# Patient Record
Sex: Male | Born: 1985 | Race: Black or African American | Hispanic: No | Marital: Single | Smoking: Current every day smoker
Health system: Southern US, Community
[De-identification: ages and names within clinical notes are randomized; demographics above are authoritative.]

## PROBLEM LIST (undated history)

## (undated) DIAGNOSIS — F1090 Alcohol use, unspecified, uncomplicated: Secondary | ICD-10-CM

## (undated) DIAGNOSIS — Z7289 Other problems related to lifestyle: Secondary | ICD-10-CM

## (undated) DIAGNOSIS — Z720 Tobacco use: Secondary | ICD-10-CM

## (undated) DIAGNOSIS — R569 Unspecified convulsions: Secondary | ICD-10-CM

---
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
## 2012-08-06 ENCOUNTER — Emergency Department (HOSPITAL_COMMUNITY)
Admission: EM | Admit: 2012-08-06 | Discharge: 2012-08-06 | Disposition: A | Discharge status: Home / Self Care | Payer: Self-pay | Attending: Emergency Medicine | Admitting: Emergency Medicine

## 2012-08-06 ENCOUNTER — Encounter (HOSPITAL_COMMUNITY): Payer: Self-pay | Admitting: Emergency Medicine

## 2012-08-06 DIAGNOSIS — L639 Alopecia areata, unspecified: Secondary | ICD-10-CM | POA: Insufficient documentation

## 2012-08-06 DIAGNOSIS — F17200 Nicotine dependence, unspecified, uncomplicated: Secondary | ICD-10-CM | POA: Insufficient documentation

## 2012-08-06 DIAGNOSIS — B359 Dermatophytosis, unspecified: Secondary | ICD-10-CM | POA: Insufficient documentation

## 2012-08-06 NOTE — ED Provider Notes (Signed)
History     CSN: [id number]  Arrival date & time 08/06/12  1012   First MD Initiated Contact with Patient 08/06/12 1014      Chief Complaint  Patient presents with  . Tinea  . Alopecia    (Consider location/radiation/quality/duration/timing/severity/associated sxs/prior treatment) HPI Comments: Patient is a 27 year old melena significant past medical history who presents for hair loss x6 months. Patient states that he has noticed a bald spot increasing in size over time and denies any drugs or clumps of hair falling out at one time. Patient admits to associated itching and denies redness or scaling of the area. Patient denies any aggravating or alleviating factors of his symptoms. He further denies fever, headache, numbness or tingling, as well as any drainage from the site. Patient states he has been homeless and is concerned about ringworm  The history is provided by the patient. No language interpreter was used.    History reviewed. No pertinent past medical history.  History reviewed. No pertinent past surgical history.  No family history on file.  History  Substance Use Topics  . Smoking status: Current Every Day Smoker  . Smokeless tobacco: Not on file  . Alcohol Use: Yes     Review of Systems  Constitutional: Negative for fever.  HENT:       +hair loss  All other systems reviewed and are negative.    Allergies  Review of patient's allergies indicates no known allergies.  Home Medications  No current outpatient prescriptions on file.  BP 137/97  Pulse 83  Temp(Src) 99 F (37.2 C) (Oral)  Resp 18  SpO2 100%  Physical Exam  Nursing note and vitals reviewed. Constitutional: He is oriented to person, place, and time. He appears well-developed and well-nourished. No distress.  HENT:  Head: Normocephalic and atraumatic. Hair is abnormal.  Right Ear: External ear normal.  Left Ear: External ear normal.  Nose: Nose normal.  +area of baldness on posterior  scalp approximately 5cm in diameter; hair extremely fine and thin toward inner border. Scalp without redness, scaling, or lesions.   Eyes: Conjunctivae and EOM are normal. No scleral icterus.  Neck: Normal range of motion.  Cardiovascular: Normal rate, regular rhythm and intact distal pulses.   Pulmonary/Chest: Effort normal. No respiratory distress.  Musculoskeletal: Normal range of motion.  Neurological: He is alert and oriented to person, place, and time.  Skin: Skin is warm and dry. No rash noted. He is not diaphoretic. No erythema. No pallor.  Psychiatric: He has a normal mood and affect. His behavior is normal.    ED Course  Procedures (including critical care time)  Labs Reviewed - No data to display No results found.   1. Alopecia areata     MDM  Alopecia areata - uncomplicated. No redness, heat to touch, scalp swelling, or scaling to suggest tinea or cellulits. Patient stable for discharge with dermatology followup. Patient also given resource guide for primary care followup. Indications for ED return discussed. Patient states comfort and understanding with this discharge plan with no unaddressed concerns. Patient seen also by Dr. Estell Harpin who is in agreement with this discharge and management plan.   Filed Vitals:   08/06/12 1023 08/06/12 1145  BP: 137/97 140/99  Pulse: 83 80  Temp: 99 F (37.2 C) 97.7 F (36.5 C)  TempSrc: Oral Oral  Resp: 18 16  SpO2: 100% 100%           Antony Madura, PA-C 08/06/12 1639

## 2012-08-06 NOTE — ED Notes (Addendum)
Pt c/o possible ringworm in head area. Pt reports been losing hair for over a year. Pt has been sleeping on the streets. Pt also reports flaky skin on bottom of feet. Pt also c/o low back pain.

## 2012-08-07 NOTE — ED Provider Notes (Signed)
Medical screening examination/treatment/procedure(s) were performed by non-physician practitioner and as supervising physician I was immediately available for consultation/collaboration.   Benny Lennert, MD 08/07/12 2330

## 2019-02-17 ENCOUNTER — Other Ambulatory Visit: Payer: Self-pay

## 2019-02-17 DIAGNOSIS — Z20822 Contact with and (suspected) exposure to covid-19: Secondary | ICD-10-CM

## 2019-02-19 LAB — NOVEL CORONAVIRUS, NAA: SARS-CoV-2, NAA: NOT DETECTED

## 2019-11-27 ENCOUNTER — Encounter (HOSPITAL_COMMUNITY): Payer: Self-pay

## 2019-11-27 ENCOUNTER — Observation Stay (HOSPITAL_COMMUNITY)
Admission: EM | Admit: 2019-11-27 | Discharge: 2019-11-28 | Disposition: A | Discharge status: Home / Self Care | Payer: Self-pay | Attending: Internal Medicine | Admitting: Internal Medicine

## 2019-11-27 ENCOUNTER — Observation Stay (HOSPITAL_COMMUNITY): Payer: Self-pay

## 2019-11-27 ENCOUNTER — Other Ambulatory Visit: Payer: Self-pay

## 2019-11-27 ENCOUNTER — Emergency Department (HOSPITAL_COMMUNITY): Payer: Self-pay

## 2019-11-27 DIAGNOSIS — F1290 Cannabis use, unspecified, uncomplicated: Secondary | ICD-10-CM | POA: Diagnosis present

## 2019-11-27 DIAGNOSIS — Z789 Other specified health status: Secondary | ICD-10-CM

## 2019-11-27 DIAGNOSIS — Z720 Tobacco use: Secondary | ICD-10-CM | POA: Diagnosis present

## 2019-11-27 DIAGNOSIS — R7401 Elevation of levels of liver transaminase levels: Secondary | ICD-10-CM | POA: Diagnosis present

## 2019-11-27 DIAGNOSIS — Z7289 Other problems related to lifestyle: Secondary | ICD-10-CM

## 2019-11-27 DIAGNOSIS — Z20822 Contact with and (suspected) exposure to covid-19: Secondary | ICD-10-CM | POA: Insufficient documentation

## 2019-11-27 DIAGNOSIS — F17200 Nicotine dependence, unspecified, uncomplicated: Secondary | ICD-10-CM | POA: Insufficient documentation

## 2019-11-27 DIAGNOSIS — F1920 Other psychoactive substance dependence, uncomplicated: Secondary | ICD-10-CM | POA: Insufficient documentation

## 2019-11-27 DIAGNOSIS — R569 Unspecified convulsions: Principal | ICD-10-CM

## 2019-11-27 DIAGNOSIS — F1090 Alcohol use, unspecified, uncomplicated: Secondary | ICD-10-CM

## 2019-11-27 DIAGNOSIS — E871 Hypo-osmolality and hyponatremia: Secondary | ICD-10-CM | POA: Diagnosis present

## 2019-11-27 DIAGNOSIS — R55 Syncope and collapse: Secondary | ICD-10-CM | POA: Insufficient documentation

## 2019-11-27 DIAGNOSIS — R413 Other amnesia: Secondary | ICD-10-CM | POA: Insufficient documentation

## 2019-11-27 HISTORY — DX: Tobacco use: Z72.0

## 2019-11-27 HISTORY — DX: Other problems related to lifestyle: Z72.89

## 2019-11-27 HISTORY — DX: Alcohol use, unspecified, uncomplicated: F10.90

## 2019-11-27 LAB — CBC WITH DIFFERENTIAL/PLATELET
Abs Immature Granulocytes: 0.03 10*3/uL (ref 0.00–0.07)
Basophils Absolute: 0 10*3/uL (ref 0.0–0.1)
Basophils Relative: 0 %
Eosinophils Absolute: 0 10*3/uL (ref 0.0–0.5)
Eosinophils Relative: 0 %
HCT: 37.2 % — ABNORMAL LOW (ref 39.0–52.0)
Hemoglobin: 12.2 g/dL — ABNORMAL LOW (ref 13.0–17.0)
Immature Granulocytes: 0 %
Lymphocytes Relative: 16 %
Lymphs Abs: 1.5 10*3/uL (ref 0.7–4.0)
MCH: 26.4 pg (ref 26.0–34.0)
MCHC: 32.8 g/dL (ref 30.0–36.0)
MCV: 80.5 fL (ref 80.0–100.0)
Monocytes Absolute: 0.9 10*3/uL (ref 0.1–1.0)
Monocytes Relative: 10 %
Neutro Abs: 6.7 10*3/uL (ref 1.7–7.7)
Neutrophils Relative %: 74 %
Platelets: 192 10*3/uL (ref 150–400)
RBC: 4.62 MIL/uL (ref 4.22–5.81)
RDW: 15.1 % (ref 11.5–15.5)
WBC: 9.2 10*3/uL (ref 4.0–10.5)
nRBC: 0 % (ref 0.0–0.2)

## 2019-11-27 LAB — ACETAMINOPHEN LEVEL: Acetaminophen (Tylenol), Serum: <10 ug/mL — ABNORMAL LOW (ref 10–30)

## 2019-11-27 LAB — COMPREHENSIVE METABOLIC PANEL
ALT: 72 U/L — ABNORMAL HIGH (ref 0–44)
AST: 115 U/L — ABNORMAL HIGH (ref 15–41)
Albumin: 4.1 g/dL (ref 3.5–5.0)
Alkaline Phosphatase: 63 U/L (ref 38–126)
Anion gap: 14 (ref 5–15)
BUN: 7 mg/dL (ref 6–20)
CO2: 17 mmol/L — ABNORMAL LOW (ref 22–32)
Calcium: 9.1 mg/dL (ref 8.9–10.3)
Chloride: 96 mmol/L — ABNORMAL LOW (ref 98–111)
Creatinine, Ser: 0.97 mg/dL (ref 0.61–1.24)
GFR calc Af Amer: >60 mL/min (ref >60)
GFR calc non Af Amer: >60 mL/min (ref >60)
Glucose, Bld: 96 mg/dL (ref 70–99)
Potassium: 4 mmol/L (ref 3.5–5.1)
Sodium: 127 mmol/L — ABNORMAL LOW (ref 135–145)
Total Bilirubin: 1 mg/dL (ref 0.3–1.2)
Total Protein: 7.4 g/dL (ref 6.5–8.1)

## 2019-11-27 LAB — RAPID URINE DRUG SCREEN, HOSP PERFORMED
Amphetamines: NOT DETECTED
Barbiturates: NOT DETECTED
Benzodiazepines: NOT DETECTED
Cocaine: NOT DETECTED
Opiates: NOT DETECTED
Tetrahydrocannabinol: POSITIVE — AB

## 2019-11-27 LAB — CBG MONITORING, ED: Glucose-Capillary: 99 mg/dL (ref 70–99)

## 2019-11-27 LAB — HEPATITIS PANEL, ACUTE
HCV Ab: NONREACTIVE
Hep A IgM: NONREACTIVE
Hep B C IgM: NONREACTIVE
Hepatitis B Surface Ag: NONREACTIVE

## 2019-11-27 LAB — HIV ANTIBODY (ROUTINE TESTING W REFLEX): HIV Screen 4th Generation wRfx: NONREACTIVE

## 2019-11-27 LAB — ETHANOL: Alcohol, Ethyl (B): <10 mg/dL (ref <10)

## 2019-11-27 LAB — OSMOLALITY, URINE: Osmolality, Ur: 435 mOsm/kg (ref 300–900)

## 2019-11-27 LAB — MAGNESIUM: Magnesium: 2 mg/dL (ref 1.7–2.4)

## 2019-11-27 LAB — TSH: TSH: 2.449 u[IU]/mL (ref 0.350–4.500)

## 2019-11-27 LAB — SARS CORONAVIRUS 2 BY RT PCR (HOSPITAL ORDER, PERFORMED IN ~~LOC~~ HOSPITAL LAB): SARS Coronavirus 2: NEGATIVE

## 2019-11-27 LAB — SODIUM, URINE, RANDOM: Sodium, Ur: 97 mmol/L

## 2019-11-27 LAB — SALICYLATE LEVEL: Salicylate Lvl: <7 mg/dL — ABNORMAL LOW (ref 7.0–30.0)

## 2019-11-27 MED ORDER — LORAZEPAM 1 MG PO TABS: 1.0000 mg | ORAL_TABLET | ORAL | Status: DC | PRN

## 2019-11-27 MED ORDER — THIAMINE HCL 100 MG/ML IJ SOLN
100.0000 mg | Freq: Every day | INTRAMUSCULAR | Status: DC
  Administered 2019-11-27: 100 mg via INTRAVENOUS
  Filled 2019-11-27: qty 2

## 2019-11-27 MED ORDER — LORAZEPAM 2 MG/ML IJ SOLN: 1.0000 mg | INTRAMUSCULAR | Status: DC | PRN

## 2019-11-27 MED ORDER — LORAZEPAM 2 MG/ML IJ SOLN: 0.0000 mg | Freq: Two times a day (BID) | INTRAMUSCULAR | Status: DC

## 2019-11-27 MED ORDER — ALBUTEROL SULFATE (2.5 MG/3ML) 0.083% IN NEBU: 2.5000 mg | INHALATION_SOLUTION | Freq: Four times a day (QID) | RESPIRATORY_TRACT | Status: DC | PRN

## 2019-11-27 MED ORDER — LORAZEPAM 2 MG/ML IJ SOLN
0.0000 mg | Freq: Four times a day (QID) | INTRAMUSCULAR | Status: DC
  Administered 2019-11-27: 1 mg via INTRAVENOUS
  Filled 2019-11-27 (×2): qty 1

## 2019-11-27 MED ORDER — ACETAMINOPHEN 650 MG RE SUPP: 650.0000 mg | Freq: Four times a day (QID) | RECTAL | Status: DC | PRN

## 2019-11-27 MED ORDER — LORAZEPAM 2 MG/ML IJ SOLN
INTRAMUSCULAR | Status: AC
  Administered 2019-11-27: 2 mg via INTRAVENOUS
  Filled 2019-11-27: qty 1

## 2019-11-27 MED ORDER — ADULT MULTIVITAMIN W/MINERALS CH
1.0000 | ORAL_TABLET | Freq: Every day | ORAL | Status: DC
  Administered 2019-11-27 – 2019-11-28 (×2): 1 via ORAL
  Filled 2019-11-27 (×2): qty 1

## 2019-11-27 MED ORDER — ACETAMINOPHEN 325 MG PO TABS: 650.0000 mg | ORAL_TABLET | Freq: Four times a day (QID) | ORAL | Status: DC | PRN

## 2019-11-27 MED ORDER — SODIUM CHLORIDE 0.9 % IV SOLN
75.0000 mL/h | INTRAVENOUS | Status: DC
  Administered 2019-11-27: 75 mL/h via INTRAVENOUS

## 2019-11-27 MED ORDER — ONDANSETRON HCL 4 MG PO TABS: 4.0000 mg | ORAL_TABLET | Freq: Four times a day (QID) | ORAL | Status: DC | PRN

## 2019-11-27 MED ORDER — ONDANSETRON HCL 4 MG/2ML IJ SOLN: 4.0000 mg | Freq: Four times a day (QID) | INTRAMUSCULAR | Status: DC | PRN

## 2019-11-27 MED ORDER — METOPROLOL TARTRATE 5 MG/5ML IV SOLN: 5.0000 mg | Freq: Four times a day (QID) | INTRAVENOUS | Status: DC | PRN

## 2019-11-27 MED ORDER — LORAZEPAM 2 MG/ML IJ SOLN
2.0000 mg | Freq: Once | INTRAMUSCULAR | Status: AC
  Administered 2019-11-27: 2 mg via INTRAVENOUS

## 2019-11-27 MED ORDER — NICOTINE 21 MG/24HR TD PT24
21.0000 mg | MEDICATED_PATCH | Freq: Every day | TRANSDERMAL | Status: DC
  Administered 2019-11-27 – 2019-11-28 (×2): 21 mg via TRANSDERMAL
  Filled 2019-11-27 (×2): qty 1

## 2019-11-27 MED ORDER — ENOXAPARIN SODIUM 40 MG/0.4ML ~~LOC~~ SOLN
40.0000 mg | SUBCUTANEOUS | Status: DC
  Administered 2019-11-28: 40 mg via SUBCUTANEOUS
  Filled 2019-11-27: qty 0.4

## 2019-11-27 MED ORDER — FOLIC ACID 1 MG PO TABS
1.0000 mg | ORAL_TABLET | Freq: Every day | ORAL | Status: DC
  Administered 2019-11-27 – 2019-11-28 (×2): 1 mg via ORAL
  Filled 2019-11-27 (×2): qty 1

## 2019-11-27 MED ORDER — THIAMINE HCL 100 MG PO TABS
100.0000 mg | ORAL_TABLET | Freq: Every day | ORAL | Status: DC
  Administered 2019-11-28: 100 mg via ORAL
  Filled 2019-11-27 (×2): qty 1

## 2019-11-27 MED ORDER — KETOROLAC TROMETHAMINE 30 MG/ML IJ SOLN: 30.0000 mg | Freq: Once | INTRAMUSCULAR | Status: DC

## 2019-11-27 MED ORDER — LEVETIRACETAM IN NACL 1500 MG/100ML IV SOLN
1500.0000 mg | Freq: Once | INTRAVENOUS | Status: AC
  Administered 2019-11-27: 1500 mg via INTRAVENOUS
  Filled 2019-11-27: qty 100

## 2019-11-27 MED ORDER — MAGNESIUM SULFATE 2 GM/50ML IV SOLN: 2.0000 g | Freq: Once | INTRAVENOUS | Status: DC

## 2019-11-27 NOTE — ED Provider Notes (Signed)
[facility] EMERGENCY DEPARTMENT Provider Note   CSN: [id number] Arrival date & time: 11/27/19  2025     History Chief Complaint  Patient presents with  . Seizures    Joshua Andrews is a 34 y.o. male presenting for evaluation after seizure.  Per EMS, mom witnessed tonic-clonic movement that lasted approximately 1 minute.  Patient had urinary incontinence, was postictal upon EMS arrival.  Patient then returned to baseline mentation.  No medications were given. Upon my evaluation, patient reports he is symptom-free.  Denies headache or body pain.  He denies recent fevers, chills, cough, nausea, vomiting, abd pain, urinary symptoms, normal bowel movements.  He states he has had a beer today, no other alcohol.  He denies tobacco or drug use.  He reports no history of medical problems, takes medications daily.  No previous history of seizures.  He denies a recent history of trauma.  HPI     History reviewed. No pertinent past medical history.  Patient Active Problem List   Diagnosis Date Noted  . Seizures (HCC) 11/27/2019    History reviewed. No pertinent surgical history.     History reviewed. No pertinent family history.  Social History   Tobacco Use  . Smoking status: Current Every Day Smoker  Substance Use Topics  . Alcohol use: Yes  . Drug use: Yes    Types: Marijuana    Home Medications Prior to Admission medications   Not on File    Allergies    Patient has no known allergies.  Review of Systems   Review of Systems  Neurological: Positive for seizures.  All other systems reviewed and are negative.   Physical Exam Updated Vital Signs BP (!) 133/96   Pulse (!) 127   Temp 98.9 F (37.2 C) (Oral)   Resp 18   Ht 5\' 11"  (1.803 m)   Wt 86.2 kg   SpO2 100%   BMI 26.50 kg/m   Physical Exam Vitals and nursing note reviewed.  Constitutional:      General: He is not in acute distress.    Appearance: He is well-developed.      Comments: Resting in the bed in no acute distress  HENT:     Head: Normocephalic and atraumatic.  Eyes:     Conjunctiva/sclera: Conjunctivae normal.     Pupils: Pupils are equal, round, and reactive to light.  Cardiovascular:     Rate and Rhythm: Normal rate and regular rhythm.     Pulses: Normal pulses.  Pulmonary:     Effort: Pulmonary effort is normal. No respiratory distress.     Breath sounds: Normal breath sounds. No wheezing.  Abdominal:     General: There is no distension.     Palpations: Abdomen is soft. There is no mass.     Tenderness: There is no abdominal tenderness. There is no guarding or rebound.  Musculoskeletal:        General: Normal range of motion.     Cervical back: Normal range of motion and neck supple.  Skin:    General: Skin is warm and dry.     Capillary Refill: Capillary refill takes less than 2 seconds.  Neurological:     Mental Status: He is alert and oriented to person, place, and time.     GCS: GCS eye subscore is 4. GCS verbal subscore is 5. GCS motor subscore is 6.     Cranial Nerves: Cranial nerves are intact.     Sensory: Sensation is  intact.     Motor: Motor function is intact.     Comments: No obvious neurologic deficit.  CN intact.  Strength and sensation intact x4.     ED Results / Procedures / Treatments   Labs (all labs ordered are listed, but only abnormal results are displayed) Labs Reviewed  CBC WITH DIFFERENTIAL/PLATELET - Abnormal; Notable for the following components:      Result Value   Hemoglobin 12.2 (*)    HCT 37.2 (*)    All other components within normal limits  COMPREHENSIVE METABOLIC PANEL - Abnormal; Notable for the following components:   Sodium 127 (*)    Chloride 96 (*)    CO2 17 (*)    AST 115 (*)    ALT 72 (*)    All other components within normal limits  SALICYLATE LEVEL - Abnormal; Notable for the following components:   Salicylate Lvl <7.0 (*)    All other components within normal limits  ACETAMINOPHEN  LEVEL - Abnormal; Notable for the following components:   Acetaminophen (Tylenol), Serum <10 (*)    All other components within normal limits  RAPID URINE DRUG SCREEN, HOSP PERFORMED - Abnormal; Notable for the following components:   Tetrahydrocannabinol POSITIVE (*)    All other components within normal limits  SARS CORONAVIRUS 2 BY RT PCR (HOSPITAL ORDER, PERFORMED IN  HOSPITAL LAB)  ETHANOL  MAGNESIUM  CBG MONITORING, ED    EKG None  Radiology CT HEAD WO CONTRAST  Result Date: 11/27/2019 CLINICAL DATA:  Seizure EXAM: CT HEAD WITHOUT CONTRAST TECHNIQUE: Contiguous axial images were obtained from the base of the skull through the vertex without intravenous contrast. COMPARISON:  CT head 05/05/2000 (report only) FINDINGS: Brain: No evidence of acute infarction, hemorrhage, hydrocephalus, extra-axial collection or mass lesion/mass effect. Incidental note made of a cavum septum pellucidum et vergae. Midline intracranial structures otherwise normal. Basal cisterns are patent. Cerebellar tonsils are normally position. Vascular: No hyperdense vessel or unexpected calcification. Skull: No calvarial fracture or suspicious osseous lesion. No scalp swelling or hematoma. Sinuses/Orbits: Paranasal sinuses and mastoid air cells are predominantly clear. Mild proptosis. Orbits are otherwise unremarkable. Other: None IMPRESSION: 1. No acute intracranial findings. 2. Mild proptosis. Electronically Signed   By: Kreg Shropshire M.D.   On: 11/27/2019 04:41    Procedures Procedures (including critical care time)  Medications Ordered in ED Medications  LORazepam (ATIVAN) 2 MG/ML injection (has no administration in time range)  LORazepam (ATIVAN) injection 2 mg (2 mg Intravenous Given 11/27/19 0313)  levETIRAcetam (KEPPRA) IVPB 1500 mg/ 100 mL premix (0 mg Intravenous Stopped 11/27/19 0414)    ED Course  I have reviewed the triage vital signs and the nursing notes.  Pertinent labs & imaging  results that were available during my care of the patient were reviewed by me and considered in my medical decision making (see chart for details).    MDM Rules/Calculators/A&P                          Patient presenting for evaluation of seizure.  On exam, patient appears nontoxic.  This is a new onset seizure, as such will obtain labs and head CT.  Patient reports alcohol use, will obtain ethanol, as well as salicylate and acetaminophen levels.  Informed by RN that patient is having a seizure.  He had approximately 1 minute of tonic-clonic movement and urinary incontinence.  He was placed on a nonrebreather during the seizure for  support.  Patient was evaluated by Dr. Daun Peacock.  Given Ativan and Keppra.  Will consult with neurology.  Discussed with Dr. Derry Lory from neurology, who recommends MRI and admission to medicine.   Labs interpreted by me, overall reassuring.  Mild acidosis extremity, likely due to seizure.  Of note, LFTs are mildly elevated, but not to the point that I do expect seizures.  Hemoglobin is stable.  Salicylate and acetaminophen levels normal.  UDS positive only for marijuana.  Head CT negative for acute findings.  Will call for admission.  Discussed with Dr. Loney Loh from [facility], patient to be admitted.  Final Clinical Impression(s) / ED Diagnoses Final diagnoses:  Seizure [facility])    Rx / DC Orders ED Discharge Orders    None       Alveria Apley, PA-C 11/27/19 0617    Palumbo, April, MD 11/27/19 2774

## 2019-11-27 NOTE — Progress Notes (Signed)
EEG complete - results pending 

## 2019-11-27 NOTE — Procedures (Signed)
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 EEG to evaluate for seizure  Level of alertness: Awake,  asleep  AEDs during EEG study: Ativan  Technical aspects: This EEG study was done with scalp electrodes positioned according to the 10-20 International system of electrode placement. Electrical activity was acquired at a sampling rate of 500Hz  and reviewed with a high frequency filter of 70Hz  and a low frequency filter of 1Hz . EEG data were recorded continuously and digitally stored.   Description: No posterior dominant rhythm was seen. Sleep was characterized by vertex waves, sleep spindles (12 to 14 Hz), maximal frontocentral region.  There is an excessive amount of 15 to 18 Hz beta activity with distributed symmetrically and diffusely.  Hyperventilation and photic stimulation were not performed.     ABNORMALITY -Excessive beta, generalized  IMPRESSION: This study is within normal limits. No seizures or epileptiform discharges were seen throughout the recording. The excessive beta activity seen in the background is most likely due to the effect of benzodiazepine and is a benign EEG pattern.  Joshua Andrews 
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
## 2019-11-27 NOTE — Care Plan (Signed)
Subjective: Patient's mother at bedside states patient has not had any further seizure-like activity.  He has been able to wake up and talk to his mother but continues to be very drowsy.  She states he does drink alcohol frequently but does not know the exact quantity.  Pertinent labs: Sodium 127, chloride 96, AST 115, ALT 72, UDS positive for THC, alcohol level less than 10  MRI brain without contrast reviewed: No acute abnormality.  Recommendations -Routine EEG to look for epileptogenicity is pending. -If routine EEG normal, this is most likely provoked seizure in setting of alcohol withdrawal and therefore does not need require long-term AEDs. -Continue management of hyponatremia per primary team -Alcohol cessation counseling -Seizure precautions including do not drive for 6 months/until cleared by physician    Seizure precautions: Per [facility] statutes, patients with seizures are not allowed to drive until they have been seizure-free for six months and cleared by a physician    Use caution when using heavy equipment or power tools. Avoid working on ladders or at heights. Take showers instead of baths. Ensure the water temperature is not too high on the home water heater. Do not go swimming alone. Do not lock yourself in a room alone (i.e. bathroom). When caring for infants or small children, sit down when holding, feeding, or changing them to minimize risk of injury to the child in the event you have a seizure. Maintain good sleep hygiene. Avoid alcohol.    If patient has another seizure, call 911 and bring them back to the ED if: A.  The seizure lasts longer than 5 minutes.      B.  The patient doesn't wake shortly after the seizure or has new problems such as difficulty seeing, speaking or moving following the seizure C.  The patient was injured during the seizure D.  The patient has a temperature over 102 F (39C) E.  The patient vomited during the seizure and now is having  trouble breathing    During the Seizure   - First, ensure adequate ventilation and place patients on the floor on their left side  Loosen clothing around the neck and ensure the airway is patent. If the patient is clenching the teeth, do not force the mouth open with any object as this can cause severe damage - Remove all items from the surrounding that can be hazardous. The patient may be oblivious to what's happening and may not even know what he or she is doing. If the patient is confused and wandering, either gently guide him/her away and block access to outside areas - Reassure the individual and be comforting - Call 911. In most cases, the seizure ends before EMS arrives. However, there are cases when seizures may last over 3 to 5 minutes. Or the individual may have developed breathing difficulties or severe injuries. If a pregnant patient or a person with diabetes develops a seizure, it is prudent to call an ambulance. - Finally, if the patient does not regain full consciousness, then call EMS. Most patients will remain confused for about 45 to 90 minutes after a seizure, so you must use judgment in calling for help. - Avoid restraints but make sure the patient is in a bed with padded side rails - Place the individual in a lateral position with the neck slightly flexed; this will help the saliva drain from the mouth and prevent the tongue from falling backward - Remove all nearby furniture and other hazards from the  area - Provide verbal assurance as the individual is regaining consciousness - Provide the patient with privacy if possible - Call for help and start treatment as ordered by the caregiver    After the Seizure (Postictal Stage)   After a seizure, most patients experience confusion, fatigue, muscle pain and/or a headache. Thus, one should permit the individual to sleep. For the next few days, reassurance is essential. Being calm and helping reorient the person is also of  importance.   Most seizures are painless and end spontaneously. Seizures are not harmful to others but can lead to complications such as stress on the lungs, brain and the heart. Individuals with prior lung problems may develop labored breathing and respiratory distress.    Sinan Tuch Annabelle Harman

## 2019-11-27 NOTE — ED Triage Notes (Signed)
Pt BIB [facility] for eval of approx 2 minute grand mal seizure witnessed by mother. EMS reports significant post-ictal period, snoring respirations. EMS reports pt did come around en route, states rude and non compliant during transport. Removed IV/EKG en route.
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
## 2019-11-27 NOTE — Progress Notes (Signed)
Pt is at MRI at this time - unavailable for EEG. Will check back as schedule permits.

## 2019-11-28 LAB — COMPREHENSIVE METABOLIC PANEL
ALT: 54 U/L — ABNORMAL HIGH (ref 0–44)
AST: 55 U/L — ABNORMAL HIGH (ref 15–41)
Albumin: 3.7 g/dL (ref 3.5–5.0)
Alkaline Phosphatase: 67 U/L (ref 38–126)
Anion gap: 12 (ref 5–15)
BUN: 7 mg/dL (ref 6–20)
CO2: 23 mmol/L (ref 22–32)
Calcium: 9.1 mg/dL (ref 8.9–10.3)
Chloride: 100 mmol/L (ref 98–111)
Creatinine, Ser: 0.96 mg/dL (ref 0.61–1.24)
GFR calc Af Amer: >60 mL/min (ref >60)
GFR calc non Af Amer: >60 mL/min (ref >60)
Glucose, Bld: 112 mg/dL — ABNORMAL HIGH (ref 70–99)
Potassium: 3.3 mmol/L — ABNORMAL LOW (ref 3.5–5.1)
Sodium: 135 mmol/L (ref 135–145)
Total Bilirubin: 1.1 mg/dL (ref 0.3–1.2)
Total Protein: 6.8 g/dL (ref 6.5–8.1)

## 2019-11-28 LAB — CBC
HCT: 36.8 % — ABNORMAL LOW (ref 39.0–52.0)
Hemoglobin: 12.3 g/dL — ABNORMAL LOW (ref 13.0–17.0)
MCH: 27.2 pg (ref 26.0–34.0)
MCHC: 33.4 g/dL (ref 30.0–36.0)
MCV: 81.4 fL (ref 80.0–100.0)
Platelets: 201 10*3/uL (ref 150–400)
RBC: 4.52 MIL/uL (ref 4.22–5.81)
RDW: 15.1 % (ref 11.5–15.5)
WBC: 9.5 10*3/uL (ref 4.0–10.5)
nRBC: 0 % (ref 0.0–0.2)

## 2019-11-28 LAB — PHOSPHORUS: Phosphorus: 4.1 mg/dL (ref 2.5–4.6)

## 2019-11-28 LAB — MAGNESIUM: Magnesium: 2.1 mg/dL (ref 1.7–2.4)

## 2019-11-28 MED ORDER — CHLORDIAZEPOXIDE HCL 25 MG PO CAPS: ORAL_CAPSULE | ORAL | 0 refills | Status: DC

## 2019-11-28 MED ORDER — CARVEDILOL 3.125 MG PO TABS: 3.1250 mg | ORAL_TABLET | Freq: Two times a day (BID) | ORAL | 11 refills | Status: DC

## 2019-11-28 MED ORDER — FOLIC ACID 1 MG PO TABS: 1.0000 mg | ORAL_TABLET | Freq: Every day | ORAL | 1 refills | Status: AC

## 2019-11-28 MED ORDER — POTASSIUM CHLORIDE CRYS ER 20 MEQ PO TBCR: 40.0000 meq | EXTENDED_RELEASE_TABLET | Freq: Once | ORAL | Status: DC

## 2019-11-28 MED ORDER — THIAMINE HCL 100 MG PO TABS: 100.0000 mg | ORAL_TABLET | Freq: Every day | ORAL | 0 refills | Status: AC

## 2019-11-28 MED ORDER — ADULT MULTIVITAMIN W/MINERALS CH: 1.0000 | ORAL_TABLET | Freq: Every day | ORAL | 0 refills | Status: AC

## 2019-11-28 NOTE — Discharge Summary (Signed)
Physician Discharge Summary  Patient ID: Joshua Andrews MRN: [id number] DOB/AGE: 33-May-1987 33 y.o.  Admit date: 11/27/2019 Discharge date: 11/28/2019  Admission Diagnoses:  Discharge Diagnoses:  Principal Problem:   Seizures (HCC) Active Problems:   Alcohol use   Hyponatremia   Transaminitis   Tobacco use   Marijuana use   Discharged Condition: Stable  Hospital Course: Patient is a 34 year old male with history of significant alcohol use.  Patient presented with 2-witnessed seizures.  Apparently, patient had stopped drinking alcohol a couple to few days earlier.  EEG was interpreted to be within normal range.  MRI without contrast did not reveal any abnormality.  No seizure medication was recommended by the neurology team.  Neurology team directed patient's management.  Patient will be discharged on Librium, thiamine and folate.  Sodium was noted to be 127, with elevated AST and ALT.  UDS was positive for tetrahydrocannabinol.  Alcohol level was less than 10.  Patient has been advised not to drive until cleared by neurology team.  Patient has also been advised to avoid operating machinery.  Patient will be discharged back home to the care of the primary care provider.  Patient will follow with neurology on discharge.  Consults: Neurology  Significant Diagnostic Studies:  -Sodium 127, chloride 96, AST 115, ALT 72, UDS positive for THC, alcohol level less than 10 -MRI brain without contrast reviewed: No acute abnormality.  EEG Finding: -Excessive beta, generalized  IMPRESSION (According to Neurologist): "This study is within normal limits. No seizures or epileptiform discharges were seen throughout the recording. The excessive beta activity seen in the background is most likely due to the effect of benzodiazepine and is a benign EEG pattern".  Discharge Exam: Blood pressure (!) 142/90, pulse 74, temperature 98.5 F (36.9 C), temperature source Oral, resp. rate 18, height 5\' 11"   (1.803 m), weight 86.2 kg, SpO2 100 %.  Disposition: Discharge disposition: 01-Home or Self Care   Discharge Instructions    Diet - low sodium heart healthy   Complete by: As directed    Increase activity slowly   Complete by: As directed      Allergies as of 11/28/2019   No Known Allergies     Medication List    TAKE these medications   carvedilol 3.125 MG tablet Commonly known as: Coreg Take 1 tablet (3.125 mg total) by mouth 2 (two) times daily.   chlordiazePOXIDE 25 MG capsule Commonly known as: LIBRIUM Librium 25 mg PO three times daily for 2 days, and then twice daly for 2 days and stop.   folic acid 1 MG tablet Commonly known as: FOLVITE Take 1 tablet (1 mg total) by mouth daily. Start taking on: November 29, 2019   multivitamin with minerals Tabs tablet Take 1 tablet by mouth daily. Start taking on: November 29, 2019   thiamine 100 MG tablet Take 1 tablet (100 mg total) by mouth daily. Start taking on: November 29, 2019        Signed: December 01, 2019 11/28/2019, 1:43 PM

## 2019-11-28 NOTE — Progress Notes (Signed)
Discharged to home after IV access removed, education provided, and all questions answered.  He was so ready to go that he did not wait for some papers that he was going to get.  He was caught smoking in the bathroom earlier in the day and had to have his cigarettes and lighter removed and was given back to him upon his discharge.

## 2019-11-28 NOTE — Progress Notes (Signed)
Pt awake, alert, interactive and appropriate. Reports he stopped drinking a couple fo days before seizure. Agree with not starting AEDs. No further recommendations from a neuro perspective, please call with further questions or concerns.   Ritta Slot, MD [facility] [phone]  If 7pm- 7am, please page neurology on call as listed in AMION.

## 2019-11-28 NOTE — Care Management (Signed)
Spoke w patient on the phone to discuss medications and PCP. Added [facility] to [facility], instructed hi to call Monday to establish there and use their discount pharmacy. Discussed MATCH letter. Instructed patient to wait in room until I came to deliver it.  Patient had discharged prior to getting MATCH so I faxed it to [facility].

## 2020-06-12 ENCOUNTER — Emergency Department (HOSPITAL_COMMUNITY): Payer: Self-pay

## 2020-06-12 ENCOUNTER — Encounter (HOSPITAL_COMMUNITY): Payer: Self-pay

## 2020-06-12 ENCOUNTER — Other Ambulatory Visit: Payer: Self-pay

## 2020-06-12 ENCOUNTER — Observation Stay (HOSPITAL_COMMUNITY)
Admission: EM | Admit: 2020-06-12 | Discharge: 2020-06-13 | Disposition: A | Discharge status: Home / Self Care | Payer: Self-pay | Attending: Family Medicine | Admitting: Family Medicine

## 2020-06-12 DIAGNOSIS — F10231 Alcohol dependence with withdrawal delirium: Secondary | ICD-10-CM | POA: Insufficient documentation

## 2020-06-12 DIAGNOSIS — F17200 Nicotine dependence, unspecified, uncomplicated: Secondary | ICD-10-CM | POA: Insufficient documentation

## 2020-06-12 DIAGNOSIS — M545 Low back pain, unspecified: Secondary | ICD-10-CM | POA: Insufficient documentation

## 2020-06-12 DIAGNOSIS — K7010 Alcoholic hepatitis without ascites: Secondary | ICD-10-CM | POA: Insufficient documentation

## 2020-06-12 DIAGNOSIS — S0990XA Unspecified injury of head, initial encounter: Principal | ICD-10-CM | POA: Insufficient documentation

## 2020-06-12 DIAGNOSIS — Z79899 Other long term (current) drug therapy: Secondary | ICD-10-CM | POA: Insufficient documentation

## 2020-06-12 DIAGNOSIS — E875 Hyperkalemia: Secondary | ICD-10-CM | POA: Insufficient documentation

## 2020-06-12 DIAGNOSIS — Y900 Blood alcohol level of less than 20 mg/100 ml: Secondary | ICD-10-CM | POA: Insufficient documentation

## 2020-06-12 DIAGNOSIS — F1990 Other psychoactive substance use, unspecified, uncomplicated: Secondary | ICD-10-CM

## 2020-06-12 DIAGNOSIS — F10930 Alcohol use, unspecified with withdrawal, uncomplicated: Secondary | ICD-10-CM | POA: Insufficient documentation

## 2020-06-12 DIAGNOSIS — W19XXXA Unspecified fall, initial encounter: Secondary | ICD-10-CM | POA: Insufficient documentation

## 2020-06-12 DIAGNOSIS — Z20822 Contact with and (suspected) exposure to covid-19: Secondary | ICD-10-CM | POA: Insufficient documentation

## 2020-06-12 DIAGNOSIS — M542 Cervicalgia: Secondary | ICD-10-CM | POA: Insufficient documentation

## 2020-06-12 DIAGNOSIS — R569 Unspecified convulsions: Secondary | ICD-10-CM

## 2020-06-12 DIAGNOSIS — F10230 Alcohol dependence with withdrawal, uncomplicated: Secondary | ICD-10-CM

## 2020-06-12 HISTORY — DX: Unspecified convulsions: R56.9

## 2020-06-12 LAB — CBC WITH DIFFERENTIAL/PLATELET
Abs Immature Granulocytes: 0.04 10*3/uL (ref 0.00–0.07)
Basophils Absolute: 0 10*3/uL (ref 0.0–0.1)
Basophils Relative: 0 %
Eosinophils Absolute: 0 10*3/uL (ref 0.0–0.5)
Eosinophils Relative: 0 %
HCT: 36.7 % — ABNORMAL LOW (ref 39.0–52.0)
Hemoglobin: 12.4 g/dL — ABNORMAL LOW (ref 13.0–17.0)
Immature Granulocytes: 0 %
Lymphocytes Relative: 9 %
Lymphs Abs: 0.8 10*3/uL (ref 0.7–4.0)
MCH: 27.1 pg (ref 26.0–34.0)
MCHC: 33.8 g/dL (ref 30.0–36.0)
MCV: 80.3 fL (ref 80.0–100.0)
Monocytes Absolute: 0.5 10*3/uL (ref 0.1–1.0)
Monocytes Relative: 5 %
Neutro Abs: 7.6 10*3/uL (ref 1.7–7.7)
Neutrophils Relative %: 86 %
Platelets: 232 10*3/uL (ref 150–400)
RBC: 4.57 MIL/uL (ref 4.22–5.81)
RDW: 16 % — ABNORMAL HIGH (ref 11.5–15.5)
WBC: 9 10*3/uL (ref 4.0–10.5)
nRBC: 0 % (ref 0.0–0.2)

## 2020-06-12 LAB — URINALYSIS, ROUTINE W REFLEX MICROSCOPIC
Bilirubin Urine: NEGATIVE
Glucose, UA: NEGATIVE mg/dL
Ketones, ur: 80 mg/dL — AB
Leukocytes,Ua: NEGATIVE
Nitrite: NEGATIVE
Protein, ur: 100 mg/dL — AB
Specific Gravity, Urine: 1.016 (ref 1.005–1.030)
pH: 5 (ref 5.0–8.0)

## 2020-06-12 LAB — COMPREHENSIVE METABOLIC PANEL
ALT: 80 U/L — ABNORMAL HIGH (ref 0–44)
AST: 170 U/L — ABNORMAL HIGH (ref 15–41)
Albumin: 4.5 g/dL (ref 3.5–5.0)
Alkaline Phosphatase: 103 U/L (ref 38–126)
Anion gap: 14 (ref 5–15)
BUN: 9 mg/dL (ref 6–20)
CO2: 20 mmol/L — ABNORMAL LOW (ref 22–32)
Calcium: 9.8 mg/dL (ref 8.9–10.3)
Chloride: 97 mmol/L — ABNORMAL LOW (ref 98–111)
Creatinine, Ser: 1 mg/dL (ref 0.61–1.24)
GFR, Estimated: >60 mL/min (ref >60)
Glucose, Bld: 84 mg/dL (ref 70–99)
Potassium: 5.4 mmol/L — ABNORMAL HIGH (ref 3.5–5.1)
Sodium: 131 mmol/L — ABNORMAL LOW (ref 135–145)
Total Bilirubin: 1 mg/dL (ref 0.3–1.2)
Total Protein: 8 g/dL (ref 6.5–8.1)

## 2020-06-12 LAB — RAPID URINE DRUG SCREEN, HOSP PERFORMED
Amphetamines: NOT DETECTED
Barbiturates: NOT DETECTED
Benzodiazepines: NOT DETECTED
Cocaine: NOT DETECTED
Opiates: NOT DETECTED
Tetrahydrocannabinol: POSITIVE — AB

## 2020-06-12 LAB — RESP PANEL BY RT-PCR (FLU A&B, COVID) ARPGX2
Influenza A by PCR: NEGATIVE
Influenza B by PCR: NEGATIVE
SARS Coronavirus 2 by RT PCR: NEGATIVE

## 2020-06-12 LAB — ETHANOL: Alcohol, Ethyl (B): <10 mg/dL (ref <10)

## 2020-06-12 MED ORDER — ENOXAPARIN SODIUM 40 MG/0.4ML ~~LOC~~ SOLN
40.0000 mg | SUBCUTANEOUS | Status: DC
  Administered 2020-06-12: 40 mg via SUBCUTANEOUS
  Filled 2020-06-12: qty 0.4

## 2020-06-12 MED ORDER — NICOTINE 21 MG/24HR TD PT24
21.0000 mg | MEDICATED_PATCH | Freq: Once | TRANSDERMAL | Status: AC
  Administered 2020-06-12: 21 mg via TRANSDERMAL
  Filled 2020-06-12: qty 1

## 2020-06-12 MED ORDER — LORAZEPAM 2 MG/ML IJ SOLN: 2.0000 mg | INTRAMUSCULAR | Status: DC | PRN

## 2020-06-12 MED ORDER — THIAMINE HCL 100 MG/ML IJ SOLN
100.0000 mg | Freq: Every day | INTRAMUSCULAR | Status: DC
  Administered 2020-06-12: 100 mg via INTRAVENOUS
  Filled 2020-06-12: qty 2

## 2020-06-12 MED ORDER — LORAZEPAM 1 MG PO TABS: 0.0000 mg | ORAL_TABLET | Freq: Four times a day (QID) | ORAL | Status: DC

## 2020-06-12 MED ORDER — LORAZEPAM 1 MG PO TABS: 0.0000 mg | ORAL_TABLET | Freq: Two times a day (BID) | ORAL | Status: DC

## 2020-06-12 MED ORDER — LACTATED RINGERS IV BOLUS
1000.0000 mL | Freq: Once | INTRAVENOUS | Status: AC
  Administered 2020-06-12: 1000 mL via INTRAVENOUS

## 2020-06-12 MED ORDER — THIAMINE HCL 100 MG PO TABS
100.0000 mg | ORAL_TABLET | Freq: Every day | ORAL | Status: DC
  Administered 2020-06-13: 100 mg via ORAL
  Filled 2020-06-12 (×2): qty 1

## 2020-06-12 MED ORDER — LORAZEPAM 2 MG/ML IJ SOLN
0.0000 mg | Freq: Four times a day (QID) | INTRAMUSCULAR | Status: DC
  Administered 2020-06-12: 2 mg via INTRAVENOUS
  Administered 2020-06-12: 1 mg via INTRAVENOUS
  Filled 2020-06-12 (×2): qty 1

## 2020-06-12 MED ORDER — LORAZEPAM 2 MG/ML IJ SOLN: 0.0000 mg | Freq: Two times a day (BID) | INTRAMUSCULAR | Status: DC

## 2020-06-12 NOTE — Plan of Care (Signed)

## 2020-06-12 NOTE — H&P (Addendum)
[facility] History and Physical Service Pager: [phone]  Patient name: Joshua Andrews Medical record number: [id number] Date of birth: 07/24/1985 Age: 35 y.o. Gender: male  Primary Care Provider: Patient, No Pcp Per Consultants: None Code Status: Full  Preferred Emergency Contact: Mother-Lucinda-[phone]  Chief Complaint: seizure  Assessment and Plan: Joshua Andrews is a 35 y.o. male presenting with ETOH withdrawal seizure . PMH is significant for ETOH abuse.  ETOH withdrawal seizure Patient fell and experienced witnessed seizure with head injury at 9am at home lasting 1-2 mins. After patient arrived in ED, he had another seizure in his room.  He was given 2 mg of Ativan which aborted the seizure. He also received 1L bolus. Labs significant for AST 170, ALT 80. Ethanol <10. K+ 5.4. Glucose normal at 84. CT head and cervical spine wnl. On physical exam no neurological deficits. Patient endorses slight tremor. Last drink was yesterday evening. Patient has experienced a seizure last year in August and after an extensive work-up in which EEG was done and neurology consulted, was found to be due to alcohol withdrawal. Current seizures likely due to alcohol withdrawal as well given history and previous work up. Admitting patient for close monitoring given his multiple seizures today. Can consider an EEG and neurological consult if patient continues to have seizures.  -Admit to med telemetry, attending Dr. Jennette KettleNeal -Vitals per floor -CIWA protocol -Seizure precautions -IV Ativan as needed -Neuro checks -Seizure precautions -Follow-up UDS -Swallow eval -10pm BMP to check K+ - NPO, incase patient has further seizures -TOC consult for alcohol cessation  -PT, OT am   ETOH abuse  Patient reports drinking 2-3 beers per night with occasional binge drinking once a week which he states he does not know how much he drinks on those nights. Occasionally drinks  alcohol when he first wakes up.  - CIWA protocol  Alcoholic hepatitis  AST 170, ALT 80 likely 2/2 ETOH use. Worsened from August last year when AST 55, ALT 54. Less likely to be related to viral hepatitis as values would be significant higher in 1000s and would expect RUQ pain. -Monitor with CMP -Patient education regarding ETOH intake  -TOC consult for ETOH cessation.   Hyperkalemia K+ 5.4  Likely due to seizures. Could also be hemolyzed blood sample. -EKG -10pm BMP to recheck K - am BMP  Hyponatremia  Na 131. Asymptomatic. Likely 2/2 EOTH intake-Beer potomania. - am BMP  Tobacco misuse disorder  Patient reports smoking almost 1 pack per day -Offer nicotine patch  -Encourage smoking cessation   Substance misuse disorder  UDS THC +. Last admission UDS positive for marijuana and patient was counseled on cessation -Encourage cessation  FEN/GI: NPO Prophylaxis: Lovenox   Disposition: med tele  History of Present Illness:  Joshua Andrews is a 35 y.o. male presenting with EOTH withdrawal seizure.  Patient reports having a seizure at 9 am while he was at his mothers house. He had a head injury and seizure lasted 1-2 minutes. Has a headache. Vomited when he first arrived in the ED.  Heavy ETOH use over the last 4 years. Last ETOH use was last night 9pm. Drinks 2-3 beers a night. Denies liqueor. Often drinks in the mornings. Binges sometimes- about once a week when he parties and is unsure how much he drinks those days. Smokes cigarettes, 1 pack a day. Admits to recreational drugs: marijuana. Is interested in quitting drinking ETOH. Was able to quit for 1 month by staying  busy and working, tho he is not currently working.   Not vaccinated against COVID.  Review Of Systems: Per HPI with the following additions:   Review of Systems  Gastrointestinal: Positive for vomiting.  Neurological: Positive for seizures and headaches.     Patient Active Problem List   Diagnosis Date  Noted  . Seizure (HCC) 06/12/2020  . Alcohol withdrawal seizure without complication (HCC)   . Hyperkalemia   . Seizures (HCC) 11/27/2019  . Alcohol use 11/27/2019  . Hyponatremia 11/27/2019  . Transaminitis 11/27/2019  . Tobacco use 11/27/2019  . Marijuana use 11/27/2019    Past Medical History: Past Medical History:  Diagnosis Date  . Alcohol use   . Seizures (HCC)   . Tobacco use     Past Surgical History: No past surgical history on file.  Social History: Social History   Tobacco Use  . Smoking status: Current Every Day Smoker  Substance Use Topics  . Alcohol use: Yes  . Drug use: Yes    Types: Marijuana   Additional social history:  Please also refer to relevant sections of EMR.  Family History: Family History  Problem Relation Age of Onset  . Cancer Maternal Grandmother   . Cancer Maternal Grandfather    If not completed, MUST add something in)  Allergies and Medications: No Known Allergies No current facility-administered medications on file prior to encounter.   Current Outpatient Medications on File Prior to Encounter  Medication Sig Dispense Refill  . carvedilol (COREG) 3.125 MG tablet Take 1 tablet (3.125 mg total) by mouth 2 (two) times daily. (Patient not taking: Reported on 06/12/2020) 60 tablet 11  . chlordiazePOXIDE (LIBRIUM) 25 MG capsule Librium 25 mg PO three times daily for 2 days, and then twice daly for 2 days and stop. (Patient not taking: Reported on 06/12/2020) 10 capsule 0  . folic acid (FOLVITE) 1 MG tablet Take 1 tablet (1 mg total) by mouth daily. (Patient not taking: Reported on 06/12/2020) 30 tablet 1  . Multiple Vitamin (MULTIVITAMIN WITH MINERALS) TABS tablet Take 1 tablet by mouth daily. (Patient not taking: Reported on 06/12/2020) 30 tablet 0  . thiamine 100 MG tablet Take 1 tablet (100 mg total) by mouth daily. (Patient not taking: Reported on 06/12/2020) 30 tablet 0    Objective: BP (!) 170/100 (BP Location: Right Arm)   Pulse 100    Temp 99.1 F (37.3 C) (Oral)   Resp 20   SpO2 100%  Exam: General: alert, on phone, NAD Eyes: PERRLA, EOMI, scleral icterus and injected conjunctiva  ENTM: MMM Neck: supple, normal ROM Cardiovascular: RRR no murmurs Respiratory: CTAB normal WOB Gastrointestinal: soft, non distended, non tender MSK: normal ROM and strength of extremities bilaterally Derm: warm, dry. No edema  Neuro: alert and oriented. CN 2-12 in tact. Sensation in tact Psych: mood and affect normal   Labs and Imaging: CBC BMET  Recent Labs  Lab 06/12/20 1037  WBC 9.0  HGB 12.4*  HCT 36.7*  PLT 232   Recent Labs  Lab 06/12/20 1037  NA 131*  K 5.4*  CL 97*  CO2 20*  BUN 9  CREATININE 1.00  GLUCOSE 84  CALCIUM 9.8     EKG: NSR   DG Lumbar Spine Complete  Result Date: 06/12/2020 CLINICAL DATA:  Fall EXAM: LUMBAR SPINE - COMPLETE 4+ VIEW; SACRUM AND COCCYX - 2+ VIEW COMPARISON:  None. FINDINGS: There are five non-rib bearing lumbar-type vertebral bodies with riblets at T12 and sacralization of L5  with bilateral assimilation joints. There is normal alignment. There is no evidence for acute fracture or subluxation. Intervertebral disc spaces are preserved without significant degenerative changes. No definitive sacral fracture identified. There are atherosclerotic calcifications visualized. IMPRESSION: 1. No acute osseous abnormality identified. 2. No definitive sacral fracture identified. 3. Transitional anatomy at the lumbosacral junction with bilateral assimilation joints. 4. There are atherosclerotic calcifications. Aortic Atherosclerosis (ICD10-I70.0). Electronically Signed   By: Meda Klinefelter MD   On: 06/12/2020 11:14   DG Sacrum/Coccyx  Result Date: 06/12/2020 CLINICAL DATA:  Fall EXAM: LUMBAR SPINE - COMPLETE 4+ VIEW; SACRUM AND COCCYX - 2+ VIEW COMPARISON:  None. FINDINGS: There are five non-rib bearing lumbar-type vertebral bodies with riblets at T12 and sacralization of L5 with bilateral  assimilation joints. There is normal alignment. There is no evidence for acute fracture or subluxation. Intervertebral disc spaces are preserved without significant degenerative changes. No definitive sacral fracture identified. There are atherosclerotic calcifications visualized. IMPRESSION: 1. No acute osseous abnormality identified. 2. No definitive sacral fracture identified. 3. Transitional anatomy at the lumbosacral junction with bilateral assimilation joints. 4. There are atherosclerotic calcifications. Aortic Atherosclerosis (ICD10-I70.0). Electronically Signed   By: Meda Klinefelter MD   On: 06/12/2020 11:14   CT HEAD WO CONTRAST  Result Date: 06/12/2020 CLINICAL DATA:  Pain after seizure and fall. EXAM: CT HEAD WITHOUT CONTRAST CT CERVICAL SPINE WITHOUT CONTRAST TECHNIQUE: Multidetector CT imaging of the head and cervical spine was performed following the standard protocol without intravenous contrast. Multiplanar CT image reconstructions of the cervical spine were also generated. COMPARISON:  November 27, 2019 FINDINGS: CT HEAD FINDINGS Brain: No evidence of acute infarction, hemorrhage, hydrocephalus, extra-axial collection or mass lesion/mass effect. Vascular: No hyperdense vessel or unexpected calcification. Skull: Normal. Negative for fracture or focal lesion. Sinuses/Orbits: Significant opacification of the right maxillary sinus, unchanged. Probable mucous retention cysts in the left maxillary sinus, unchanged. Other paranasal sinuses are stable as well. Mastoid air cells and middle ears are well aerated. Other: None. CT CERVICAL SPINE FINDINGS Alignment: Mild reversal of normal lordosis centered at C5-6. No other malalignment. Skull base and vertebrae: No acute fracture. No primary bone lesion or focal pathologic process. Soft tissues and spinal canal: No prevertebral fluid or swelling. No visible canal hematoma. Disc levels:  No significant degenerative changes. Upper chest: Negative. Other: No  other abnormalities. IMPRESSION: 1. No acute intracranial abnormality. 2. Sinus disease as above. 3. No fracture or traumatic malalignment in the cervical spine. Electronically Signed   By: Gerome Sam III M.D   On: 06/12/2020 12:51   CT CERVICAL SPINE WO CONTRAST  Result Date: 06/12/2020 CLINICAL DATA:  Pain after seizure and fall. EXAM: CT HEAD WITHOUT CONTRAST CT CERVICAL SPINE WITHOUT CONTRAST TECHNIQUE: Multidetector CT imaging of the head and cervical spine was performed following the standard protocol without intravenous contrast. Multiplanar CT image reconstructions of the cervical spine were also generated. COMPARISON:  November 27, 2019 FINDINGS: CT HEAD FINDINGS Brain: No evidence of acute infarction, hemorrhage, hydrocephalus, extra-axial collection or mass lesion/mass effect. Vascular: No hyperdense vessel or unexpected calcification. Skull: Normal. Negative for fracture or focal lesion. Sinuses/Orbits: Significant opacification of the right maxillary sinus, unchanged. Probable mucous retention cysts in the left maxillary sinus, unchanged. Other paranasal sinuses are stable as well. Mastoid air cells and middle ears are well aerated. Other: None. CT CERVICAL SPINE FINDINGS Alignment: Mild reversal of normal lordosis centered at C5-6. No other malalignment. Skull base and vertebrae: No acute fracture. No primary bone lesion  or focal pathologic process. Soft tissues and spinal canal: No prevertebral fluid or swelling. No visible canal hematoma. Disc levels:  No significant degenerative changes. Upper chest: Negative. Other: No other abnormalities. IMPRESSION: 1. No acute intracranial abnormality. 2. Sinus disease as above. 3. No fracture or traumatic malalignment in the cervical spine. Electronically Signed   By: Gerome Sam III M.D   On: 06/12/2020 12:51    Towanda Octave, MD 06/12/2020, 9:26 PM PGY-1, [facility] Family Medicine FPTS Intern pager: [phone], text pages welcome  FPTS  Upper-Level Resident Addendum   I have independently interviewed and examined the patient. I have discussed the above with the original author and agree with their documentation. My edits for correction/addition/clarification are in blue. Please see also any attending notes.   Towanda Octave MD PGY-2, [facility] Family Medicine 06/12/2020 9:26 PM  FPTS Service pager: [phone] (text pages welcome through [facility])

## 2020-06-12 NOTE — ED Notes (Signed)
Secretary came and grabbed this RN from another pt's room stating pt was having a seizure. This RN went in immediately. Pt had snoring respirations, full body shaking and unresponsive. Per MD 2mg  of ativan given IV. Seizure pads placed on bed. Pt maintaining airway. Will continue to monitor.

## 2020-06-12 NOTE — ED Triage Notes (Signed)
Pt BIB [facility] from home c/o a seizure. Pt was at his mom's house. Pt's mother heard a thud went out into the hallway and found pt having a seizure. Pt had full body shaking for about 1-2 mins. Pt haws been postictal for about an hr now per ems. Pt coming around just slow to answer at this time. Pt has a hx of having seizures about a month prior, seen neurologist and could not find anything wrong per mother. Pt denies any pain at this.

## 2020-06-12 NOTE — ED Notes (Signed)
Patient transported to X-ray 

## 2020-06-12 NOTE — ED Provider Notes (Signed)
[facility] EMERGENCY DEPARTMENT Provider Note   CSN: [id number] Arrival date & time: 06/12/20  2119     History Chief Complaint  Patient presents with  . Seizures    Joshua Andrews is a 35 y.o. male with past medical history of daily alcohol use is brought to the ED from mother's house for evaluation of possible seizure.  Per triage note, mother heard a thud and went into the hallway and found patient having a seizure described as full body shaking for 1 to 2 minutes.  Reportedly patient was postictal for about an hour per EMS.  On arrival patient is awake, alert able to tell me his full name but slow to answer and appears tired.  States he remembers walking to the bathroom and "falling out".  He woke up in the ambulance.  He thinks he had a seizure.  Reports having one in the past.  He was admitted for this and states he does not remember what the doctor said.  He did not follow-up with neurology.  He is not on any medicines for seizures.  Reports left-sided head and neck pain and left low back pain that is worse with movement and palpation.  Denies any visual changes, nausea, vomiting, chest pain, shortness of breath, abdominal pain.  He thinks he bit his left tongue.  No bladder or bowel incontinence.  Reports daily alcohol use.  Cannot give me an exact amount but states "a lot of beer".  Denies illicit drug use other than marijuana.  Reports sometimes when he does not drink he gets a little shaky but denies headaches, nausea, vomiting.  He is not at all interested in stopping alcohol anytime soon.  He is on the phone with his girlfriend.  Interventions.  No modifying factors.  HPI     Past Medical History:  Diagnosis Date  . Alcohol use   . Seizures ([place])   . Tobacco use     Patient Active Problem List   Diagnosis Date Noted  . Seizure ([place]) 06/12/2020  . Seizures ([place]) 11/27/2019  . Alcohol use 11/27/2019  . Hyponatremia 11/27/2019  . Transaminitis 11/27/2019   . Tobacco use 11/27/2019  . Marijuana use 11/27/2019    No past surgical history on file.     Family History  Problem Relation Age of Onset  . Cancer Maternal Grandmother   . Cancer Maternal Grandfather     Social History   Tobacco Use  . Smoking status: Current Every Day Smoker  Substance Use Topics  . Alcohol use: Yes  . Drug use: Yes    Types: Marijuana    Home Medications Prior to Admission medications   Medication Sig Start Date End Date Taking? Authorizing Provider  carvedilol (COREG) 3.125 MG tablet Take 1 tablet (3.125 mg total) by mouth 2 (two) times daily. 11/28/19 11/27/20  Bonnell Public, MD  chlordiazePOXIDE (LIBRIUM) 25 MG capsule Librium 25 mg PO three times daily for 2 days, and then twice daly for 2 days and stop. 11/28/19   Bonnell Public, MD  folic acid (FOLVITE) 1 MG tablet Take 1 tablet (1 mg total) by mouth daily. 11/29/19   Bonnell Public, MD  Multiple Vitamin (MULTIVITAMIN WITH MINERALS) TABS tablet Take 1 tablet by mouth daily. 11/29/19   Dana Allan I, MD  thiamine 100 MG tablet Take 1 tablet (100 mg total) by mouth daily. 11/29/19   Bonnell Public, MD    Allergies    Patient has  no known allergies.  Review of Systems   Review of Systems  Musculoskeletal: Positive for neck pain.  Neurological: Positive for seizures and headaches.  All other systems reviewed and are negative.   Physical Exam Updated Vital Signs BP (!) 148/106   Pulse (!) 110   Temp 98.2 F (36.8 C) (Oral)   Resp 15   SpO2 100%   Physical Exam Vitals and nursing note reviewed.  Constitutional:      Appearance: He is well-developed.     Comments: Non toxic.  HENT:     Head: Normocephalic.     Comments: No facial pain.  Tenderness over the left occiput, left paraspinal cervical muscle area.  He is not on a c-collar.  No battle signs.      Nose: Nose normal.     Mouth/Throat:     Comments: Left tongue abrasion.  Dentition intact. Eyes:      General: Scleral icterus present.     Conjunctiva/sclera: Conjunctivae normal.     Comments: PERRL bilaterally.  Pupils 2-3 mm.  Neck:     Comments: Tenderness over the left occiput, left paraspinal cervical muscles and left trapezius.  Decreased range of motion due to pain.  Trachea midline.  No midline tenderness. Cardiovascular:     Rate and Rhythm: Normal rate and regular rhythm.     Heart sounds: Normal heart sounds.  Pulmonary:     Effort: Pulmonary effort is normal.     Breath sounds: Normal breath sounds.  Abdominal:     General: Bowel sounds are normal.     Palpations: Abdomen is soft.     Tenderness: There is no abdominal tenderness.  Musculoskeletal:        General: Tenderness present. Normal range of motion.     Cervical back: Normal range of motion. Tenderness present.     Comments: TL spine: No thoracic midline tenderness or paraspinal tenderness.  Low lumbar midline and left-sided tenderness.  Tenderness over the left upper buttock.  No bruising or contusions in the back or buttocks.  Pelvis: No A.P.L. stability with compression.  Full range of motion of the hips without any pain.  Spontaneously moving his legs and repositioning in bed.  Skin:    General: Skin is warm and dry.     Capillary Refill: Capillary refill takes less than 2 seconds.  Neurological:     Mental Status: He is alert.     Comments:   Mental Status: Patient is awake, alert, oriented to person, place, year.  Amnesic to details of events.  Patient is able to give a clear and coherent history.  Speech is fluent and clear without dysarthria or aphasia.  No signs of neglect.  Cranial Nerves: I not tested II visual fields full bilaterally. PERRL.   III, IV, VI EOMs intact without ptosis V sensation to light touch intact in all 3 divisions of trigeminal nerve bilaterally  VII facial movements symmetric bilaterally VIII hearing intact to voice/conversation  IX, X no uvula deviation, symmetric rise of  soft palate/uvula XI 5/5 SCM and trapezius strength bilaterally  XII tongue protrusion midline, symmetric L/R movements  Motor: Strength 5/5 in upper/lower extremities.   Sensation to light touch intact in face, upper/lower extremities. No pronator drift. No leg drop.  Cerebellar: No ataxia with finger to nose.   Psychiatric:        Behavior: Behavior normal.     ED Results / Procedures / Treatments   Labs (all labs ordered are listed,  but only abnormal results are displayed) Labs Reviewed  CBC WITH DIFFERENTIAL/PLATELET - Abnormal; Notable for the following components:      Result Value   Hemoglobin 12.4 (*)    HCT 36.7 (*)    RDW 16.0 (*)    All other components within normal limits  COMPREHENSIVE METABOLIC PANEL - Abnormal; Notable for the following components:   Sodium 131 (*)    Potassium 5.4 (*)    Chloride 97 (*)    CO2 20 (*)    AST 170 (*)    ALT 80 (*)    All other components within normal limits  RESP PANEL BY RT-PCR (FLU A&B, COVID) ARPGX2  ETHANOL  URINALYSIS, ROUTINE W REFLEX MICROSCOPIC  RAPID URINE DRUG SCREEN, HOSP PERFORMED  MAGNESIUM  PHOSPHORUS  CBG MONITORING, ED    EKG EKG Interpretation  Date/Time:  Sunday June 12 2020 10:29:41 EST Ventricular Rate:  99 PR Interval:    QRS Duration: 98 QT Interval:  346 QTC Calculation: 444 R Axis:   2 Text Interpretation: Sinus tachycardia Atrial premature complex Borderline low voltage, extremity leads Baseline wander in lead(s) V3 No STEMI Confirmed by Octaviano Glow [phone]) on 06/12/2020 10:36:57 AM   Radiology DG Lumbar Spine Complete  Result Date: 06/12/2020 CLINICAL DATA:  Fall EXAM: LUMBAR SPINE - COMPLETE 4+ VIEW; SACRUM AND COCCYX - 2+ VIEW COMPARISON:  None. FINDINGS: There are five non-rib bearing lumbar-type vertebral bodies with riblets at T12 and sacralization of L5 with bilateral assimilation joints. There is normal alignment. There is no evidence for acute fracture or subluxation.  Intervertebral disc spaces are preserved without significant degenerative changes. No definitive sacral fracture identified. There are atherosclerotic calcifications visualized. IMPRESSION: 1. No acute osseous abnormality identified. 2. No definitive sacral fracture identified. 3. Transitional anatomy at the lumbosacral junction with bilateral assimilation joints. 4. There are atherosclerotic calcifications. Aortic Atherosclerosis (ICD10-I70.0). Electronically Signed   By: Valentino Saxon MD   On: 06/12/2020 11:14   DG Sacrum/Coccyx  Result Date: 06/12/2020 CLINICAL DATA:  Fall EXAM: LUMBAR SPINE - COMPLETE 4+ VIEW; SACRUM AND COCCYX - 2+ VIEW COMPARISON:  None. FINDINGS: There are five non-rib bearing lumbar-type vertebral bodies with riblets at T12 and sacralization of L5 with bilateral assimilation joints. There is normal alignment. There is no evidence for acute fracture or subluxation. Intervertebral disc spaces are preserved without significant degenerative changes. No definitive sacral fracture identified. There are atherosclerotic calcifications visualized. IMPRESSION: 1. No acute osseous abnormality identified. 2. No definitive sacral fracture identified. 3. Transitional anatomy at the lumbosacral junction with bilateral assimilation joints. 4. There are atherosclerotic calcifications. Aortic Atherosclerosis (ICD10-I70.0). Electronically Signed   By: Valentino Saxon MD   On: 06/12/2020 11:14   CT HEAD WO CONTRAST  Result Date: 06/12/2020 CLINICAL DATA:  Pain after seizure and fall. EXAM: CT HEAD WITHOUT CONTRAST CT CERVICAL SPINE WITHOUT CONTRAST TECHNIQUE: Multidetector CT imaging of the head and cervical spine was performed following the standard protocol without intravenous contrast. Multiplanar CT image reconstructions of the cervical spine were also generated. COMPARISON:  November 27, 2019 FINDINGS: CT HEAD FINDINGS Brain: No evidence of acute infarction, hemorrhage, hydrocephalus,  extra-axial collection or mass lesion/mass effect. Vascular: No hyperdense vessel or unexpected calcification. Skull: Normal. Negative for fracture or focal lesion. Sinuses/Orbits: Significant opacification of the right maxillary sinus, unchanged. Probable mucous retention cysts in the left maxillary sinus, unchanged. Other paranasal sinuses are stable as well. Mastoid air cells and middle ears are well aerated. Other: None. CT CERVICAL  SPINE FINDINGS Alignment: Mild reversal of normal lordosis centered at C5-6. No other malalignment. Skull base and vertebrae: No acute fracture. No primary bone lesion or focal pathologic process. Soft tissues and spinal canal: No prevertebral fluid or swelling. No visible canal hematoma. Disc levels:  No significant degenerative changes. Upper chest: Negative. Other: No other abnormalities. IMPRESSION: 1. No acute intracranial abnormality. 2. Sinus disease as above. 3. No fracture or traumatic malalignment in the cervical spine. Electronically Signed   By: Dorise Bullion III M.D   On: 06/12/2020 12:51   CT CERVICAL SPINE WO CONTRAST  Result Date: 06/12/2020 CLINICAL DATA:  Pain after seizure and fall. EXAM: CT HEAD WITHOUT CONTRAST CT CERVICAL SPINE WITHOUT CONTRAST TECHNIQUE: Multidetector CT imaging of the head and cervical spine was performed following the standard protocol without intravenous contrast. Multiplanar CT image reconstructions of the cervical spine were also generated. COMPARISON:  November 27, 2019 FINDINGS: CT HEAD FINDINGS Brain: No evidence of acute infarction, hemorrhage, hydrocephalus, extra-axial collection or mass lesion/mass effect. Vascular: No hyperdense vessel or unexpected calcification. Skull: Normal. Negative for fracture or focal lesion. Sinuses/Orbits: Significant opacification of the right maxillary sinus, unchanged. Probable mucous retention cysts in the left maxillary sinus, unchanged. Other paranasal sinuses are stable as well. Mastoid air  cells and middle ears are well aerated. Other: None. CT CERVICAL SPINE FINDINGS Alignment: Mild reversal of normal lordosis centered at C5-6. No other malalignment. Skull base and vertebrae: No acute fracture. No primary bone lesion or focal pathologic process. Soft tissues and spinal canal: No prevertebral fluid or swelling. No visible canal hematoma. Disc levels:  No significant degenerative changes. Upper chest: Negative. Other: No other abnormalities. IMPRESSION: 1. No acute intracranial abnormality. 2. Sinus disease as above. 3. No fracture or traumatic malalignment in the cervical spine. Electronically Signed   By: Dorise Bullion III M.D   On: 06/12/2020 12:51    Procedures .Critical Care Performed by: Kinnie Feil, PA-C Authorized by: Kinnie Feil, PA-C   Critical care provider statement:    Critical care time (minutes):  45   Critical care was necessary to treat or prevent imminent or life-threatening deterioration of the following conditions: alcohol withdrawal seizure.   Critical care was time spent personally by me on the following activities:  Discussions with consultants, evaluation of patient's response to treatment, examination of patient, ordering and performing treatments and interventions, ordering and review of laboratory studies, ordering and review of radiographic studies, pulse oximetry, re-evaluation of patient's condition, obtaining history from patient or surrogate and review of old charts   I assumed direction of critical care for this patient from another provider in my specialty: no       Medications Ordered in ED Medications  LORazepam (ATIVAN) injection 0-4 mg (0 mg Intravenous Not Given 06/12/20 1615)    Or  LORazepam (ATIVAN) tablet 0-4 mg ( Oral See Alternative 06/12/20 1615)  LORazepam (ATIVAN) injection 0-4 mg (has no administration in time range)    Or  LORazepam (ATIVAN) tablet 0-4 mg (has no administration in time range)  thiamine tablet 100 mg (  Oral See Alternative 06/12/20 1127)    Or  thiamine (B-1) injection 100 mg (100 mg Intravenous Given 06/12/20 1127)  nicotine (NICODERM CQ - dosed in mg/24 hours) patch 21 mg (has no administration in time range)  enoxaparin (LOVENOX) injection 40 mg (has no administration in time range)  lactated ringers bolus 1,000 mL (1,000 mLs Intravenous New Bag/Given 06/12/20 1337)    ED  Course  I have reviewed the triage vital signs and the nursing notes.  Pertinent labs & imaging results that were available during my care of the patient were reviewed by me and considered in my medical decision making (see chart for details).  Clinical Course as of 06/12/20 1630  Sun Jun 12, 2020  1017 EEG 11/2019  IMPRESSION: This study is within normal limits. No seizures or epileptiform discharges were seen throughout the recording. The excessive beta activity seen in the background is most likely due to the effect of benzodiazepine and is a benign EEG pattern.  Joshua Andrews [CG]  40 35 yo male w/ hx of daily etoh consumption, prior etoh-withdrawal seizures, presenting with seizure from home.  Last drink reportedly last night.  Had a seizure at home today.  Seen initially by PA provider and had CT imaging ordered - no acute traumatic injuries noted.  At 1 pm I was called into the room as tthe pt was seizing.  Generalized tonic clonic seizure, lasting 2-3 minutes, 2 mg IV ativan given, with cessation.  Possible tongue bite.  He remains post ictal, we'll reassess mental status.  He had refused treatment or admission earlier - we'll discuss with him again when he's more sober.  He is not interested in stopping drinking; however these seizures can absolutely be life threatening. [MT]  1623 EKG 12-Lead Sinus tachycardia Atrial premature complex Borderline low voltage, extremity leads Baseline wander in lead(s) V3 No STEMI Confirmed by Octaviano Glow [phone]) on 06/12/2020 10:36:57 AM [CG]    Clinical Course User  Index [CG] Kinnie Feil, PA-C [MT] Langston Masker, Carola Rhine, MD   MDM Rules/Calculators/A&P                          35 year old male brought to the ED after having a witnessed seizure.  History of same a year ago.  He admits to heavy daily alcohol use.  Does not have interest in quitting at all.  On my exam initially he is fully alert.  Reports mild nausea but resolved tachycardia.  No tremors.  Reports headache and left-sided neck pain, left low back pain.  EMR, triage nurse notes reviewed  Last hospitalization 2021 reviewed-admitted for seizure, had EEG that was unremarkable.  Neurology was consulted and suspected alcohol withdrawal seizure.  He was not started on any antiepileptics.  DDx: High suspicion for alcohol withdrawal seizure.  He cannot tell me at the time that he last had a drink.  Does not appear to be in severe withdrawals on arrival.  He is hypertensive and reports nausea but no obvious tremors, tachycardia.  Will check electrolytes.  Seizure precautions.  Labs, imaging ordered by me as above.  Labs and imaging personally visualized and interpreted  Labs reveal-sodium 131, K5.4, AST 170, ALT 80.  Normal alk phos, total bilirubin.  Glucose 84.  Hemoglobin 12.4, HCT 36.7  Imaging reveals-CT head and cervical spine unremarkable.  X-ray lumbar sacrum unremarkable.  EKG shows sinus tachycardia, PACs.  Medicines ordered- Patient was placed on CIWA protocol.  Per RN CIWA was 0 at 1035.  1 L LR bolus, thiamine.  Had lengthy discussion with patient and girlfriend in the phone about etiology of seizures from alcohol withdrawal.  Stressed importance of alcohol cessation, admission to the hospital.  Patient refused several times despite girlfriend trying to convince him otherwise.  Will give IV fluids, reassess.  1315: Patient had a witnessed seizure generalized lasting 1 to  2 minutes.  Stopped after 2 mg of Ativan.  Noted right tongue injury with small bleeding.  Seizure pads  on.  1625: Patient now alert.  Had discussion with patient and his mother on the phone regarding recurrent seizures today.  Recommended admission.  After lengthy discussion with patient and his mother patient finally agreed to be admitted to the hospital.  Discussed with family medicine who will come see patient.  Shared with EDP. Repeat CIWA 4, but will ask RN to give 1 mg ativan.   Final Clinical Impression(s) / ED Diagnoses Final diagnoses:  Alcohol withdrawal seizure without complication [facility])    Rx / DC Orders ED Discharge Orders    None       Arlean Hopping 06/12/20 1630    Wyvonnia Dusky, MD 06/12/20 [phone]

## 2020-06-13 DIAGNOSIS — K7010 Alcoholic hepatitis without ascites: Secondary | ICD-10-CM

## 2020-06-13 DIAGNOSIS — F1990 Other psychoactive substance use, unspecified, uncomplicated: Secondary | ICD-10-CM

## 2020-06-13 LAB — COMPREHENSIVE METABOLIC PANEL
ALT: 59 U/L — ABNORMAL HIGH (ref 0–44)
AST: 77 U/L — ABNORMAL HIGH (ref 15–41)
Albumin: 4.2 g/dL (ref 3.5–5.0)
Alkaline Phosphatase: 89 U/L (ref 38–126)
Anion gap: 15 (ref 5–15)
BUN: 5 mg/dL — ABNORMAL LOW (ref 6–20)
CO2: 18 mmol/L — ABNORMAL LOW (ref 22–32)
Calcium: 9.6 mg/dL (ref 8.9–10.3)
Chloride: 99 mmol/L (ref 98–111)
Creatinine, Ser: 1.08 mg/dL (ref 0.61–1.24)
GFR, Estimated: >60 mL/min (ref >60)
Glucose, Bld: 61 mg/dL — ABNORMAL LOW (ref 70–99)
Potassium: 4.1 mmol/L (ref 3.5–5.1)
Sodium: 132 mmol/L — ABNORMAL LOW (ref 135–145)
Total Bilirubin: 1.4 mg/dL — ABNORMAL HIGH (ref 0.3–1.2)
Total Protein: 8 g/dL (ref 6.5–8.1)

## 2020-06-13 LAB — CBC
HCT: 36.4 % — ABNORMAL LOW (ref 39.0–52.0)
Hemoglobin: 12 g/dL — ABNORMAL LOW (ref 13.0–17.0)
MCH: 26.4 pg (ref 26.0–34.0)
MCHC: 33 g/dL (ref 30.0–36.0)
MCV: 80 fL (ref 80.0–100.0)
Platelets: 226 10*3/uL (ref 150–400)
RBC: 4.55 MIL/uL (ref 4.22–5.81)
RDW: 15.7 % — ABNORMAL HIGH (ref 11.5–15.5)
WBC: 11 10*3/uL — ABNORMAL HIGH (ref 4.0–10.5)
nRBC: 0 % (ref 0.0–0.2)

## 2020-06-13 LAB — PHOSPHORUS: Phosphorus: 4 mg/dL (ref 2.5–4.6)

## 2020-06-13 LAB — MAGNESIUM: Magnesium: 2.5 mg/dL — ABNORMAL HIGH (ref 1.7–2.4)

## 2020-06-13 MED ORDER — ADULT MULTIVITAMIN W/MINERALS CH
1.0000 | ORAL_TABLET | Freq: Every day | ORAL | Status: DC
  Administered 2020-06-13: 1 via ORAL
  Filled 2020-06-13: qty 1

## 2020-06-13 MED ORDER — FOLIC ACID 1 MG PO TABS
1.0000 mg | ORAL_TABLET | Freq: Every day | ORAL | Status: DC
  Administered 2020-06-13: 1 mg via ORAL
  Filled 2020-06-13: qty 1

## 2020-06-13 MED ORDER — AMLODIPINE BESYLATE 5 MG PO TABS: 5.0000 mg | ORAL_TABLET | Freq: Every day | ORAL | 0 refills | Status: AC

## 2020-06-13 MED ORDER — AMLODIPINE BESYLATE 5 MG PO TABS
5.0000 mg | ORAL_TABLET | Freq: Every day | ORAL | Status: DC
  Administered 2020-06-13: 5 mg via ORAL
  Filled 2020-06-13: qty 1

## 2020-06-13 NOTE — Evaluation (Signed)
Clinical/Bedside Swallow Evaluation Patient Details  Name: Joshua Andrews MRN: [id number] Date of Birth: 11-26-1985  Today's Date: 06/13/2020 Time: SLP Start Time (ACUTE ONLY): 0347 SLP Stop Time (ACUTE ONLY): 0925 SLP Time Calculation (min) (ACUTE ONLY): 20 min  Past Medical History:  Past Medical History:  Diagnosis Date  . Alcohol use   . Seizures ([facility])   . Tobacco use    Past Surgical History: History reviewed. No pertinent surgical history. HPI:  35 y.o. male presenting with ETOH withdrawal seizure . PMH is significant for ETOH abuse.   Assessment / Plan / Recommendation Clinical Impression  Pt presents with adequate orofacial strength and ROM, adequate dentition. No history of swallowing difficulty prior to admit. Pt accepted trials of thin liquids, puree, and solid textures. No obvious oral issues or overt s/s aspiration on any consistency. Recommend continuing with regular diet and thin liquds. No further ST intervention recommended at this time.   SLP Visit Diagnosis: Dysphagia, unspecified (R13.10)    Aspiration Risk  Mild aspiration risk    Diet Recommendation Regular;Thin liquid   Liquid Administration via: Cup;Straw Medication Administration: Whole meds with liquid Supervision: Patient able to self feed Compensations: Minimize environmental distractions;Small sips/bites;Slow rate Postural Changes: Seated upright at 90 degrees    Other  Recommendations Oral Care Recommendations: Oral care BID   Follow up Recommendations None          Prognosis Prognosis for Safe Diet Advancement: Good      Swallow Study   General Date of Onset: 06/12/20 HPI: 35 y.o. male presenting with ETOH withdrawal seizure . PMH is significant for ETOH abuse. Type of Study: Bedside Swallow Evaluation Previous Swallow Assessment: none Diet Prior to this Study: Regular;Thin liquids Temperature Spikes Noted: No Respiratory Status: Room air History of Recent Intubation:  No Behavior/Cognition: Alert;Cooperative;Pleasant mood Oral Cavity Assessment: Within Functional Limits Oral Care Completed by SLP: No Oral Cavity - Dentition: Adequate natural dentition Vision: Functional for self-feeding Self-Feeding Abilities: Able to feed self Patient Positioning: Upright in bed Baseline Vocal Quality: Normal Volitional Cough: Strong Volitional Swallow: Able to elicit    Oral/Motor/Sensory Function Overall Oral Motor/Sensory Function: Within functional limits   Ice Chips Ice chips: Not tested   Thin Liquid Thin Liquid: Within functional limits Presentation: Straw    Nectar Thick Nectar Thick Liquid: Not tested   Honey Thick Honey Thick Liquid: Not tested   Puree Puree: Within functional limits Presentation: Self Fed   Solid     Solid: Within functional limits Presentation: Self Fed     Kimyetta Flott B. Murvin Natal, RaLPh [facility], CCC-SLP Speech Language Pathologist Office: [phone]  Leigh Aurora 06/13/2020,9:28 AM

## 2020-06-13 NOTE — Hospital Course (Addendum)
Joshua Andrews is a 35 y.o. male presenting with ETOH withdrawal seizure . PMH is significant for ETOH abuse.  EtOH withdrawal with seizures Patient admitted to the hospital after a witnessed seizure-like activity with head injury.  In the ED, patient had a second seizure and was given 2 mg of Ativan which aborted this seizure.  Labs were significant for elevated liver enzymes, ethanol <10, K5.4.  Patient was noted to have prior admission with alcohol withdrawal seizure in August 2021 with extensive work-up with neurology that was found to be negative for neurogenic cause.  CIWA's protocol with Ativan was put in place.  Alcoholic hepatitis AST and ALT were elevated upon admission likely secondary to EtOH use.  Measurements were worsened from August 2021, patient asymptomatic with benign abdominal exam.***  HTN Patient's blood pressures were significantly elevated during his hospitalization.  Likely secondary to active alcohol withdrawal, recommendation to follow-up in the outpatient setting.   Issues for follow-up: Continue alcohol cessation encouragement and education Recommend PCP follow-up with elevated blood pressures in hospital.  Possibly secondary to alcohol withdrawal, but unable to rule out underlying HTN as well. Started Amlodipine 5 mg daily given persistent HTN. Did not start ACE-I/ARB given concern for lack of follow up outpatient.

## 2020-06-13 NOTE — Discharge Instructions (Signed)
Dear Joshua Andrews,   Thank you so much for allowing [place] to be part of your care!  You were admitted to [facility] for alcohol associated seizures. We are glad you are feeling well today.  Please make sure to follow up with your PCP about your health after you leave, particularly regarding alcohol cessation and to follow up on your high blood pressure.    POST-HOSPITAL & CARE INSTRUCTIONS 1. Refrain from alcohol. Please see information below for resources 2. Begin amlodipine 5mg  daily for your high blood pressure 3. Please let PCP/Specialists know of any changes that were made.  4. Please see medications section of this packet for any medication changes.   DOCTOR'S APPOINTMENT & FOLLOW UP CARE INSTRUCTIONS  No future appointments.  RETURN PRECAUTIONS: Return if you experience additional seizures  Take care and be well!  Family Medicine Teaching Service Inpatient Team [place]  [facility]  [address] [place], [place] [phone]    Alcohol Abuse and Dependence Information, Adult Alcohol is a widely available drug. People drink alcohol in different amounts. People who drink alcohol very often and in large amounts often have problems during and after drinking. They may develop what is called an alcohol use disorder. There are two main types of alcohol use disorders:  Alcohol abuse. This is when you use alcohol too much or too often. You may use alcohol to make yourself feel happy or to reduce stress. You may have a hard time setting a limit on the amount you drink.  Alcohol dependence. This is when you use alcohol consistently for a period of time, and your body changes as a result. This can make it hard to stop drinking because you may start to feel sick or feel different when you do not use alcohol. These symptoms are known as withdrawal. How can alcohol abuse and dependence affect me? Alcohol abuse and dependence can have a negative effect  on your life. Drinking too much can lead to addiction. You may feel like you need alcohol to function normally. You may drink alcohol before work in the morning, during the day, or as soon as you get home from work in the evening. These actions can result in:  Poor work performance.  Job loss.  Financial problems.  Car crashes or criminal charges from driving after drinking alcohol.  Problems in your relationships with friends and family.  Losing the trust and respect of coworkers, friends, and family. Drinking heavily over a long period of time can permanently damage your body and brain, and can cause lifelong health issues, such as:  Damage to your liver or pancreas.  Heart problems, high blood pressure, or stroke.  Certain cancers.  Decreased ability to fight infections.  Brain or nerve damage.  Depression.  Early (premature) death. If you are careless or you crave alcohol, it is easy to drink more than your body can handle (overdose). Alcohol overdose is a serious situation that requires hospitalization. It may lead to permanent injuries or death. What can increase my risk?  Having a family history of alcohol abuse.  Having depression or other mental health conditions.  Beginning to drink at an early age.  Binge drinking often.  Experiencing trauma, stress, and an unstable home life during childhood.  Spending time with people who drink often. What actions can I take to prevent or manage alcohol abuse and dependence?  Do not drink alcohol if: ? Your health care provider tells  you not to drink. ? You are pregnant, may be pregnant, or are planning to become pregnant.  If you drink alcohol: ? Limit how much you use to:  0-1 drink a day for women.  0-2 drinks a day for men. ? Be aware of how much alcohol is in your drink. In the [place] one drink equals one 12 oz bottle of beer (355 mL), one 5 oz glass of wine (148 mL), or one 1 oz glass of hard liquor (44  mL).  Stop drinking if you have been drinking too much. This can be very hard to do if you are used to abusing alcohol. If you begin to have withdrawal symptoms, talk with your health care provider or a person that you trust. These symptoms may include anxiety, shaky hands, headache, nausea, sweating, or not being able to sleep.  Choose to drink nonalcoholic beverages in social gatherings and places where there may be alcohol. Activity  Spend more time on activities that you enjoy that do not involve alcohol, like hobbies or exercise.  Find healthy ways to cope with stress, such as exercise, meditation, or spending time with people you care about. General information  Talk to your family, coworkers, and friends about supporting you in your efforts to stop drinking. If they drink, ask them not to drink around you. Spend more time with people who do not drink alcohol.  If you think that you have an alcohol dependency problem: ? Tell friends or family about your concerns. ? Talk with your health care provider or another health professional about where to get help. ? Work with a Paramedic and a Network engineer. ? Consider joining a support group for people who struggle with alcohol abuse and dependence. Where to find support  Your health care provider.  SMART Recovery: www.smartrecovery.org Therapy and support groups  Local treatment centers or chemical dependency counselors.  Local AA groups in your community: SalaryStart.tn   Where to find more information  Centers for Disease Control and Prevention: FootballExhibition.com.br  [employer] on Alcohol Abuse and Alcoholism: BasicStudents.dk  Alcoholics Anonymous (AA): SalaryStart.tn Contact a health care provider if:  You drank more or for longer than you intended on more than one occasion.  You tried to stop drinking or to cut back on how much you drink, but you were not able to.  You often drink to the point of vomiting or  passing out.  You want to drink so badly that you cannot think about anything else.  You have problems in your life due to drinking, but you continue to drink.  You keep drinking even though you feel anxious, depressed, or have experienced memory loss.  You have stopped doing the things you used to enjoy in order to drink.  You have to drink more than you used to in order to get the effect you want.  You experience anxiety, sweating, nausea, shakiness, and trouble sleeping when you try to stop drinking. Get help right away if:  You have thoughts about hurting yourself or others.  You have serious withdrawal symptoms, including: ? Confusion. ? Racing heart. ? High blood pressure. ? Fever. If you ever feel like you may hurt yourself or others, or have thoughts about taking your own life, get help right away. You can go to your nearest emergency department or call:  Your local emergency services (911 in the [place]  A suicide crisis helpline, such as the National Suicide Prevention Lifeline at (214) 324-1466. This is  open 24 hours a day. Summary  Alcohol abuse and dependence can have a negative effect on your life. Drinking too much or too often can lead to addiction.  If you drink alcohol, limit how much you use.  If you are having trouble keeping your drinking under control, find ways to change your behavior. Hobbies, calming activities, exercise, or support groups can help.  If you feel you need help with changing your drinking habits, talk with your health care provider, a good friend, or a therapist, or go to an AA group. This information is not intended to replace advice given to you by your health care provider. Make sure you discuss any questions you have with your health care provider. Document Revised: 07/15/2018 Document Reviewed: 06/03/2018 Elsevier Patient Education  2021 [employer].

## 2020-06-13 NOTE — Progress Notes (Signed)
OT Cancellation Note  Patient Details Name: Joshua Andrews MRN: [id number] DOB: January 30, 1986   Cancelled Treatment:    Reason Eval/Treat Not Completed: OT screened, no needs identified, will sign off  [facility]  Luisa Dago, OT/L   Acute OT Clinical Specialist Acute Rehabilitation Services Pager [phone] Office [phone]  06/13/2020, 8:55 AM

## 2020-06-13 NOTE — Progress Notes (Signed)
Pt given discharge instructions, prescriptions, and care notes. Pt verbalized understanding AEB no further questions or concerns at this time. IV was discontinued, no redness, pain, or swelling noted at this time. Telemetry discontinued and Centralized Telemetry was notified. Pt left the floor via ambulation with staff in stable condition. 

## 2020-06-13 NOTE — Discharge Summary (Signed)
Family Medicine Teaching [facility] Discharge Summary  Patient name: Orvan Papadakis Medical record number: [id number] Date of birth: 1985/06/10 Age: 35 y.o. Gender: male Date of Admission: 06/12/2020  Date of Discharge: 06/14/19 Admitting Physician: Nestor Ramp, MD  Primary Care Provider: Patient, No Pcp Per Consultants: None  Indication for Hospitalization: seizure  Discharge Diagnoses/Problem List:  Alcohol associated seizure Alcoholic hepatitis  Hypertension   Disposition: home  Discharge Condition: stable  Discharge Exam:   Temp:  [98.7 F (37.1 C)-99.8 F (37.7 C)] 98.7 F (37.1 C) (03/07 0518) Pulse Rate:  [99-100] 100 (03/07 1155) Resp:  [18-20] 18 (03/07 0518) BP: (170-189)/(100-132) 174/116 (03/07 0838) SpO2:  [94 %-100 %] 94 % (03/07 5427)  Physical Exam: General: alert, NAD Cardiovascular: RRR no murmurs Respiratory: CTAB normal WOB Abdomen: soft, non distended, non tender Extremities: warm, dry. No edema  Brief Hospital Course:   Renee Beale is a 35 y.o. male presenting with ETOH withdrawal seizure . PMH is significant for ETOH abuse.  EtOH withdrawal with seizures Patient admitted to the hospital after a witnessed seizure-like activity with head injury.  In the ED, patient had a second seizure and was given 2 mg of Ativan which aborted this seizure.  Labs were significant for elevated liver enzymes, ethanol <10, K5.4.  Patient was noted to have prior admission with alcohol withdrawal seizure in August 2021 with extensive work-up with neurology that was found to be negative for neurogenic cause.  CIWA's protocol with Ativan was put in place. Patient remained stable during his hospitalization without any additional seizures, and was counseled on importance of alcohol cessation which patient expressed understanding.   Alcoholic hepatitis AST and ALT were elevated upon admission likely secondary to EtOH use.  Measurements were worsened from August 2021,  patient asymptomatic with benign abdominal exam.  HTN Patient's blood pressures were significantly elevated during his hospitalization.  Likely secondary to active alcohol withdrawal, recommendation to follow-up in the outpatient setting  Issues for Follow Up:  1. Continue alcohol cessation encouragement and education 2. Recommend PCP follow-up with elevated blood pressures in hospital.  Possibly secondary to alcohol withdrawal, but unable to rule out underlying HTN as well. 3. Started Amlodipine 5 mg daily given persistent HTN. Did not start ACE-I/ARB given concern for lack of follow up outpatient.   Significant Procedures: None   Significant Labs and Imaging:  Recent Labs  Lab 06/12/20 1037 06/13/20 0007  WBC 9.0 11.0*  HGB 12.4* 12.0*  HCT 36.7* 36.4*  PLT 232 226   Recent Labs  Lab 06/12/20 1037 06/13/20 0007  NA 131* 132*  K 5.4* 4.1  CL 97* 99  CO2 20* 18*  GLUCOSE 84 61*  BUN 9 5*  CREATININE 1.00 1.08  CALCIUM 9.8 9.6  MG  --  2.5*  PHOS  --  4.0  ALKPHOS 103 89  AST 170* 77*  ALT 80* 59*  ALBUMIN 4.5 4.2     Results/Tests Pending at Time of Discharge: None  Discharge Medications:  Allergies as of 06/13/2020   No Known Allergies     Medication List    STOP taking these medications   carvedilol 3.125 MG tablet Commonly known as: Coreg   chlordiazePOXIDE 25 MG capsule Commonly known as: LIBRIUM     TAKE these medications   amLODipine 5 MG tablet Commonly known as: NORVASC Take 1 tablet (5 mg total) by mouth daily.   folic acid 1 MG tablet Commonly known as: FOLVITE Take 1 tablet (1 mg  total) by mouth daily.   multivitamin with minerals Tabs tablet Take 1 tablet by mouth daily.   thiamine 100 MG tablet Take 1 tablet (100 mg total) by mouth daily.       Discharge Instructions: Please refer to Patient Instructions section of EMR for full details.  Patient was counseled important signs and symptoms that should prompt return to medical  care, changes in medications, dietary instructions, activity restrictions, and follow up appointments.   Follow-Up Appointments:   Cora Collum, DO 06/13/2020, 5:00 PM PGY-1, [facility] Family Medicine

## 2020-06-13 NOTE — Evaluation (Signed)
Physical Therapy Evaluation Patient Details Name: Joshua Andrews MRN: [id number] DOB: 1986/02/08 Today's Date: 06/13/2020   History of Present Illness  35 y.o. male presenting with ETOH withdrawal seizure lasting 1-2 min and resulting in head injury. In ED pt had another seizure which was aborted with administration of Ativan. CT of head and cervical spine WNL, no neurological deficits on physical exan, slight tremor. Admitted 06/12/20 for ETOH abuse, alcoholic hepatitis hyperkalemia, and hyponatremia PMH is significant for ETOH abuse.  Clinical Impression  Patient evaluated by Physical Therapy with no further acute PT needs identified. All education has been completed and the patient has no further questions. Pt is at baseline level of function and has no follow-up Physical Therapy or equipment needs. PT is signing off. Thank you for this referral.     Follow Up Recommendations No PT follow up    Equipment Recommendations  None recommended by PT       Precautions / Restrictions Precautions Precautions: Fall Precaution Comments: EtOH withdrawl seizure Restrictions Weight Bearing Restrictions: No      Mobility  Bed Mobility Overal bed mobility: Independent                  Transfers Overall transfer level: Independent                  Ambulation/Gait Ambulation/Gait assistance: Independent Gait Distance (Feet): 100 Feet Assistive device: None Gait Pattern/deviations: WFL(Within Functional Limits);Step-through pattern Gait velocity: slowed Gait velocity interpretation: >2.62 ft/sec, indicative of community ambulatory General Gait Details: slowed, steady gait        Balance Overall balance assessment: Mild deficits observed, not formally tested                                           Pertinent Vitals/Pain Pain Assessment: No/denies pain    Home Living Family/patient expects to be discharged to:: Private residence Living Arrangements:  Spouse/significant other;Parent Available Help at Discharge: Family;Available 24 hours/day Type of Home: Apartment Home Access: Stairs to enter   Entrance Stairs-Number of Steps: 4 Home Layout: One level Home Equipment: None      Prior Function Level of Independence: Independent                  Extremity/Trunk Assessment   Upper Extremity Assessment Upper Extremity Assessment: Overall WFL for tasks assessed    Lower Extremity Assessment Lower Extremity Assessment: Overall WFL for tasks assessed       Communication   Communication: No difficulties  Cognition Arousal/Alertness: Awake/alert Behavior During Therapy: WFL for tasks assessed/performed Overall Cognitive Status: Within Functional Limits for tasks assessed                                        General Comments General comments (skin integrity, edema, etc.): at rest HR 98 bpm, with ambulation max HR noted 146bpm, BP 174/116, SaO2 on RA .90%O2        Assessment/Plan    PT Assessment Patent does not need any further PT services         PT Goals (Current goals can be found in the Care Plan section)  Acute Rehab PT Goals Patient Stated Goal: get breakfast and go home PT Goal Formulation: With patient     AM-PAC PT "6 Clicks" Mobility  Outcome Measure Help needed turning from your back to your side while in a flat bed without using bedrails?: None Help needed moving from lying on your back to sitting on the side of a flat bed without using bedrails?: None Help needed moving to and from a bed to a chair (including a wheelchair)?: None Help needed standing up from a chair using your arms (e.g., wheelchair or bedside chair)?: None Help needed to walk in hospital room?: None Help needed climbing 3-5 steps with a railing? : None 6 Click Score: 24    End of Session   Activity Tolerance: Patient tolerated treatment well Patient left: in bed;with call bell/phone within reach;with bed  alarm set Nurse Communication: Mobility status;Other (comment) (increased HR, BP)      Time: 1443-1540 PT Time Calculation (min) (ACUTE ONLY): 14 min   Charges:   PT Evaluation $PT Eval Moderate Complexity: 1 Mod          Elizabeth B. Beverely Risen PT, DPT Acute Rehabilitation Services Pager [phone] Office [phone]   Elon Alas [facility] 06/13/2020, 8:47 AM

## 2020-07-17 ENCOUNTER — Emergency Department (HOSPITAL_COMMUNITY)
Admission: EM | Admit: 2020-07-17 | Discharge: 2020-07-17 | Disposition: A | Discharge status: Home / Self Care | Payer: Self-pay | Attending: Emergency Medicine | Admitting: Emergency Medicine

## 2020-07-17 DIAGNOSIS — F17200 Nicotine dependence, unspecified, uncomplicated: Secondary | ICD-10-CM | POA: Insufficient documentation

## 2020-07-17 DIAGNOSIS — R569 Unspecified convulsions: Secondary | ICD-10-CM | POA: Insufficient documentation

## 2020-07-17 DIAGNOSIS — Z79899 Other long term (current) drug therapy: Secondary | ICD-10-CM | POA: Insufficient documentation

## 2020-07-17 DIAGNOSIS — R000 Tachycardia, unspecified: Secondary | ICD-10-CM | POA: Insufficient documentation

## 2020-07-17 DIAGNOSIS — Z8669 Personal history of other diseases of the nervous system and sense organs: Secondary | ICD-10-CM | POA: Insufficient documentation

## 2020-07-17 LAB — BASIC METABOLIC PANEL
Anion gap: 12 (ref 5–15)
BUN: 5 mg/dL — ABNORMAL LOW (ref 6–20)
CO2: 20 mmol/L — ABNORMAL LOW (ref 22–32)
Calcium: 9.6 mg/dL (ref 8.9–10.3)
Chloride: 103 mmol/L (ref 98–111)
Creatinine, Ser: 1.05 mg/dL (ref 0.61–1.24)
GFR, Estimated: >60 mL/min (ref >60)
Glucose, Bld: 87 mg/dL (ref 70–99)
Potassium: 4.8 mmol/L (ref 3.5–5.1)
Sodium: 135 mmol/L (ref 135–145)

## 2020-07-17 LAB — CBC
HCT: 41.4 % (ref 39.0–52.0)
Hemoglobin: 13.4 g/dL (ref 13.0–17.0)
MCH: 27.3 pg (ref 26.0–34.0)
MCHC: 32.4 g/dL (ref 30.0–36.0)
MCV: 84.5 fL (ref 80.0–100.0)
Platelets: 234 10*3/uL (ref 150–400)
RBC: 4.9 MIL/uL (ref 4.22–5.81)
RDW: 17 % — ABNORMAL HIGH (ref 11.5–15.5)
WBC: 13.7 10*3/uL — ABNORMAL HIGH (ref 4.0–10.5)
nRBC: 0 % (ref 0.0–0.2)

## 2020-07-17 LAB — ETHANOL: Alcohol, Ethyl (B): <10 mg/dL (ref <10)

## 2020-07-17 NOTE — Discharge Instructions (Signed)
Please read and follow all provided instructions.  Your diagnoses today include:  1. Seizure (HCC)     Tests performed today include: Blood cell counts (white, red, and platelets) Electrolytes  Kidney function test  Vital signs. See below for your results today.   Medications prescribed:   None  Take any prescribed medications only as directed.  Home care instructions:  Follow any educational materials contained in this packet.  Avoid alcohol as it appears to be contributing to your seizures.   Standard seizure precautions: Per [facility] statutes, patients with seizures are not allowed to drive until they have been seizure-free for six months. Use caution when using heavy equipment or power tools. Avoid working on ladders or at heights. Take showers instead of baths. Ensure the water temperature is not too high on the home water heater. Do not go swimming alone. When caring for infants or small children, sit down when holding, feeding, or changing them to minimize risk of injury to the child in the event you have a seizure. To reduce risk of seizures, maintain good sleep hygiene avoid alcohol and illicit drug use, take all anti-seizure medications as prescribed.   BE VERY CAREFUL not to take multiple medicines containing Tylenol (also called acetaminophen). Doing so can lead to an overdose which can damage your liver and cause liver failure and possibly death.   Follow-up instructions: Please follow-up with your primary care provider in the next 3 days for further evaluation of your symptoms.   Return instructions:   Please return to the Emergency Department if you experience worsening symptoms.   Return with additional seizures.   Please return if you have any other emergent concerns.  Additional Information:  Your vital signs today were: BP (!) 164/111   Pulse (!) 101   Temp 99 F (37.2 C) (Oral)   Resp 14   SpO2 100%  If your blood pressure (BP) was  elevated above 135/85 this visit, please have this repeated by your doctor within one month. --------------

## 2020-07-17 NOTE — ED Triage Notes (Signed)
Pt bib gems after witnessed full body seizure lasting 1 approx 1 minute. Denies hitting head. Hx of seizures - not currently on any medication. Mother reports pt has had 3 seizures in the past month. Mother reports ETOH use yesterday. Pt post ictal and combative on EMS arrival. 5 mg versed given IM by EMS.   BP: 160/80  HR: 120  CBG: 130  Spo2: 97% RA

## 2020-07-17 NOTE — ED Provider Notes (Signed)
[facility] EMERGENCY DEPARTMENT Provider Note   CSN: [id number] Arrival date & time: 07/17/20  5397     History Chief Complaint  Patient presents with  . Seizures    Joshua Andrews is a 35 y.o. male.  Patient with history of alcohol-related seizures presents the emergency department after having a seizure today.  Patient is lethargic and somnolent after being given 5 mg of Versed by EMS because he was combative and postictal.  Level 5 caveat due to altered mental status.  Per EMS report, patient had a witnessed tonic-clonic seizure this morning lasting about 1 minute.  Last reported EtOH yesterday.  Per family, 3 seizures in the past month.        Past Medical History:  Diagnosis Date  . Alcohol use   . Seizures (HCC)   . Tobacco use     Patient Active Problem List   Diagnosis Date Noted  . Substance use disorder   . Alcoholic hepatitis   . Seizure (HCC) 06/12/2020  . Alcohol withdrawal seizure without complication (HCC)   . Hyperkalemia   . Seizures (HCC) 11/27/2019  . Alcohol use 11/27/2019  . Hyponatremia 11/27/2019  . Transaminitis 11/27/2019  . Tobacco use 11/27/2019  . Marijuana use 11/27/2019    No past surgical history on file.     Family History  Problem Relation Age of Onset  . Cancer Maternal Grandmother   . Cancer Maternal Grandfather     Social History   Tobacco Use  . Smoking status: Current Every Day Smoker  Substance Use Topics  . Alcohol use: Yes  . Drug use: Yes    Types: Marijuana    Home Medications Prior to Admission medications   Medication Sig Start Date End Date Taking? Authorizing Provider  amLODipine (NORVASC) 5 MG tablet Take 1 tablet (5 mg total) by mouth daily. 06/13/20   Cora Collum, DO  folic acid (FOLVITE) 1 MG tablet Take 1 tablet (1 mg total) by mouth daily. Patient not taking: Reported on 06/12/2020 11/29/19   Berton Mount I, MD  Multiple Vitamin (MULTIVITAMIN WITH MINERALS) TABS  tablet Take 1 tablet by mouth daily. Patient not taking: Reported on 06/12/2020 11/29/19   Berton Mount I, MD  thiamine 100 MG tablet Take 1 tablet (100 mg total) by mouth daily. Patient not taking: Reported on 06/12/2020 11/29/19   Barnetta Chapel, MD    Allergies    Patient has no known allergies.  Review of Systems   Review of Systems  Unable to perform ROS: Mental status change  Neurological: Positive for seizures.    Physical Exam Updated Vital Signs BP (!) 143/111   Pulse (!) 110   Temp 98.6 F (37 C) (Oral)   Resp 20   SpO2 97%   Physical Exam Vitals and nursing note reviewed.  Constitutional:      Appearance: He is well-developed.  HENT:     Head: Normocephalic and atraumatic.  Eyes:     Conjunctiva/sclera: Conjunctivae normal.     Pupils: Pupils are equal, round, and reactive to light.  Cardiovascular:     Rate and Rhythm: Tachycardia present.  Pulmonary:     Effort: Pulmonary effort is normal. No respiratory distress.     Breath sounds: Normal breath sounds.  Abdominal:     Tenderness: There is no abdominal tenderness.  Musculoskeletal:     Cervical back: Normal range of motion and neck supple.  Skin:    General: Skin is warm and  dry.  Neurological:     Mental Status: He is alert.     Comments: Arousable to voice.  Patient quickly falls back asleep however does not answer questions.     ED Results / Procedures / Treatments   Labs (all labs ordered are listed, but only abnormal results are displayed) Labs Reviewed  CBC - Abnormal; Notable for the following components:      Result Value   WBC 13.7 (*)    RDW 17.0 (*)    All other components within normal limits  BASIC METABOLIC PANEL - Abnormal; Notable for the following components:   CO2 20 (*)    BUN 5 (*)    All other components within normal limits  ETHANOL    EKG EKG Interpretation  Date/Time:  Sunday July 17 2020 06:54:25 EDT Ventricular Rate:  116 PR Interval:  154 QRS  Duration: 74 QT Interval:  300 QTC Calculation: 417 R Axis:   1 Text Interpretation: Sinus tachycardia Probable left atrial enlargement ST elev, probable normal early repol pattern No significant change since last tracing Confirmed by Melene Plan [phone]) on 07/17/2020 6:59:00 AM   Radiology No results found.  Procedures Procedures   Medications Ordered in ED Medications - No data to display  ED Course  I have reviewed the triage vital signs and the nursing notes.  Pertinent labs & imaging results that were available during my care of the patient were reviewed by me and considered in my medical decision making (see chart for details).  Patient seen and examined. Work-up initiated. Pt sleeping soundly.  Currently awakens to voice but falls back to sleep quickly. Does not answer questions.   Vital signs reviewed and are as follows: BP (!) 143/111   Pulse (!) 110   Temp 98.6 F (37 C) (Oral)   Resp 20   SpO2 97%   9:47 AM Pt rechecked. He is awake, but sleepy. Answers questions appropriately. Will check CIWA, fluid challenge, ambulate.   12:07 PM Pt doing well. CIWA 2. Still tired and requests time to rest.  States that he was drinking alcohol yesterday.  We will plan for discharge to home as he is close to his baseline.  He was able to ambulate without any significant problems.  1:34 PM Pt stable, will discharge.   BP (!) 164/111   Pulse (!) 101   Temp 99 F (37.2 C) (Oral)   Resp 14   SpO2 100%      MDM Rules/Calculators/A&P                          Patient with reportedly witnessed seizure today, postictal and required Versed with EMS.  Patient has returned to baseline.  Labs are unremarkable.  He was drinking alcohol yesterday and has been evaluated in the past and diagnosed with suspected alcohol withdrawal seizures.  I suspect alcohol is continuing to contribute.  No further seizure activity during monitoring in the emergency department.  No dangerous or  life-threatening conditions suspected or identified by history, physical exam, and by work-up. No indications for hospitalization identified.      Final Clinical Impression(s) / ED Diagnoses Final diagnoses:  Seizure [facility])    Rx / DC Orders ED Discharge Orders    None       Renne Crigler, PA-C 07/17/20 1338    Melene Plan, DO 07/17/20 1343

## 2020-07-17 NOTE — ED Notes (Signed)
Fluid challenge completed.  Drank 200 cc without nausea or emesis, PA notified

## 2020-07-17 NOTE — ED Notes (Signed)
Pt voided on self and all over floor, waking up off versed, no acute distress, escorted to bathroom to have BM, assisted with cleaning himself up and paper scrubs given, his wet clothes bagged and tagged for him. He has them at bedside

## 2021-09-26 IMAGING — MR MR HEAD W/O CM
14 of 15 series · 43 of 48 positions shown · non-contrast
Comparison: Head CT 11/27/2019

CLINICAL DATA: Seizure, abnormal neuro exam.

EXAM:
MRI HEAD WITHOUT CONTRAST
TECHNIQUE: Multiplanar, multiecho pulse sequences of the brain and surrounding
structures were obtained without intravenous contrast.

[Series 5: DWI · axial · 3.0mm · 0.88mm/px · z∈[-78,+68]mm · 7 of 100 slices shown (1 of 4)]
[im 1/100]
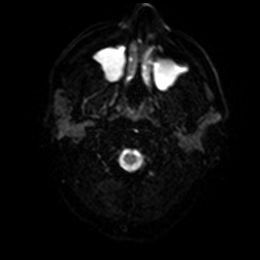
[im 17/100]
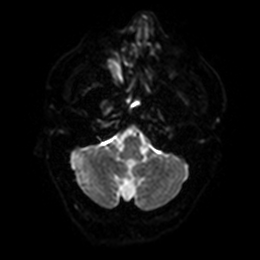
[im 34/100]
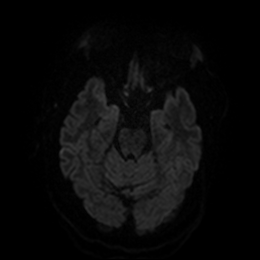
[im 50/100]
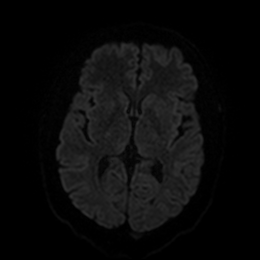
[im 67/100]
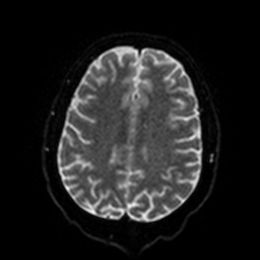
[im 83/100]
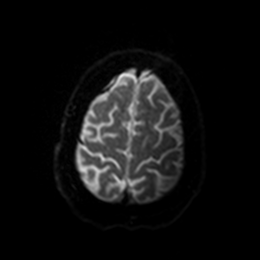
[im 100/100]
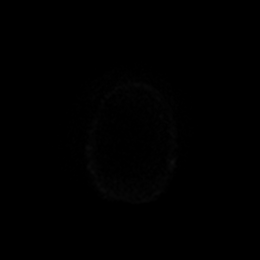

[Series 6: DWI · axial · 3.0mm · 0.88mm/px · z∈[-78,+68]mm · 4 of 50 slices shown (2 of 4)]
[im 1/50]
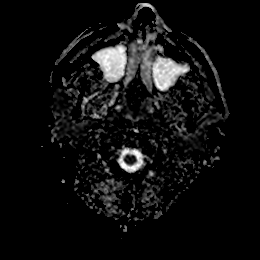
[im 17/50]
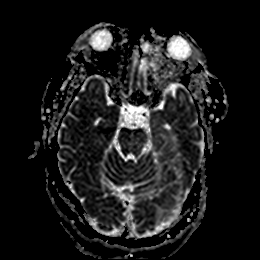
[im 33/50]
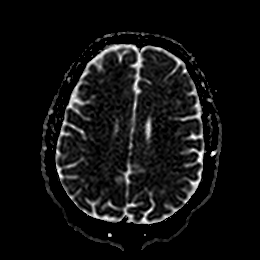
[im 50/50]
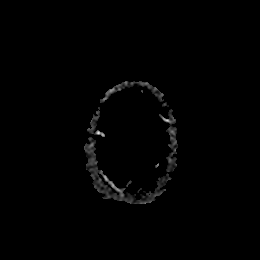

[Series 7: DWI · coronal · 4.0mm · 0.88mm/px · 5 of 76 slices shown (3 of 4)]
[im 1/76]
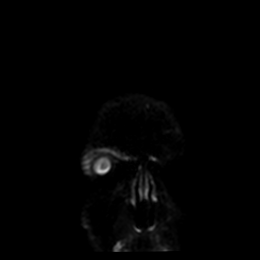
[im 19/76]
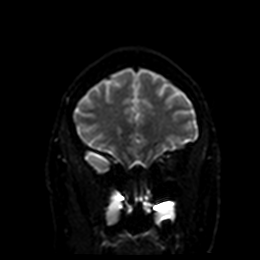
[im 38/76]
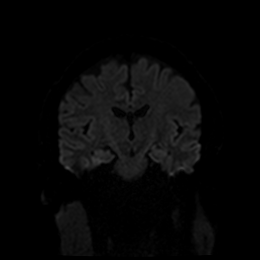
[im 57/76]
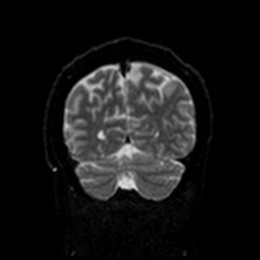
[im 76/76]
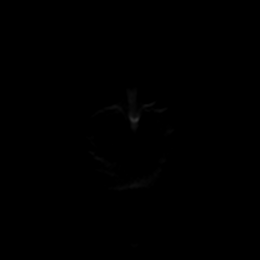

[Series 8: DWI · coronal · 4.0mm · 0.88mm/px · 3 of 38 slices shown (4 of 4)]
[im 1/38]
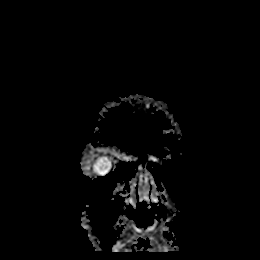
[im 19/38]
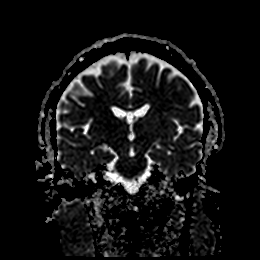
[im 38/38]
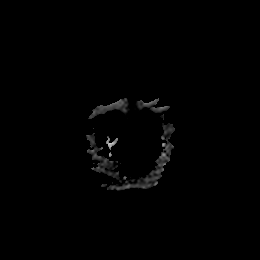

[Series 9: T1 · sagittal · 5.0mm · 0.75mm/px · 2 of 25 slices shown]
[im 1/25]
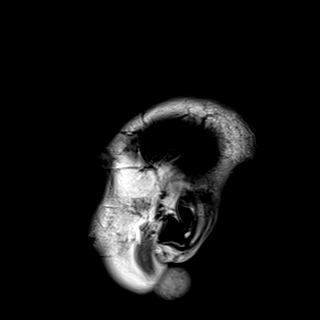
[im 25/25]
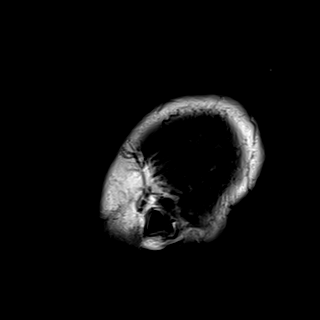

[Series 10: T2 · axial · 5.0mm · 0.72mm/px · z∈[-81,+74]mm · 2 of 27 slices shown (1 of 3)]
[im 1/27]
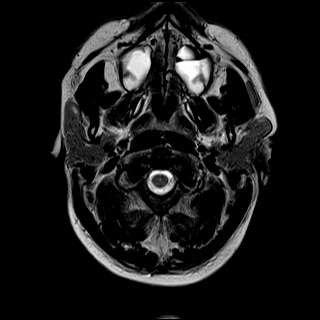
[im 27/27]
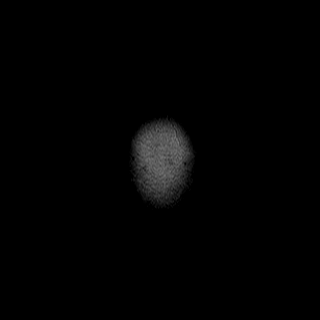

[Series 12: T2 · coronal · 3.0mm · 0.27mm/px · 3 of 36 slices shown (2 of 3)]
[im 1/36]
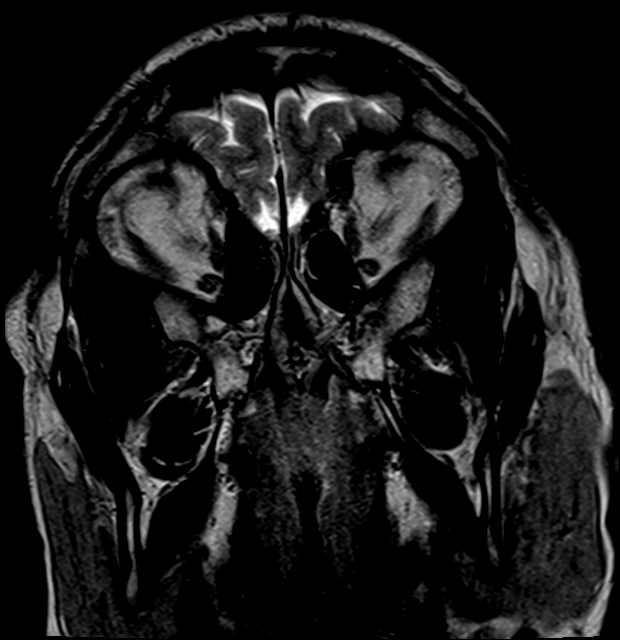
[im 18/36]
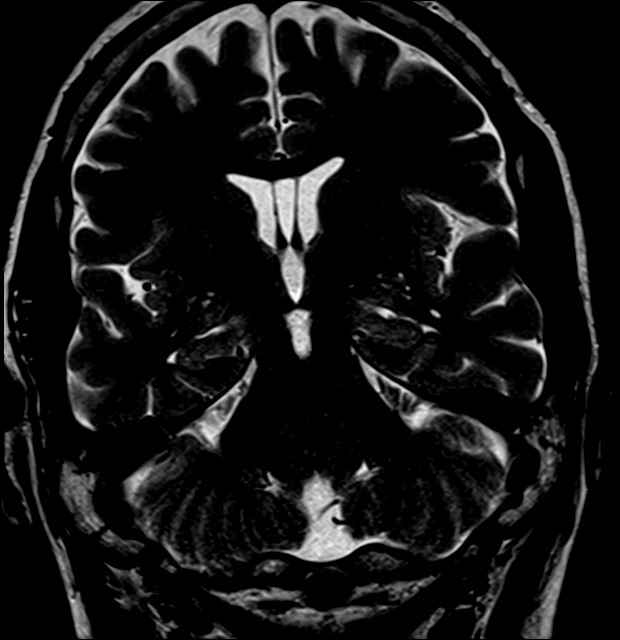
[im 36/36]
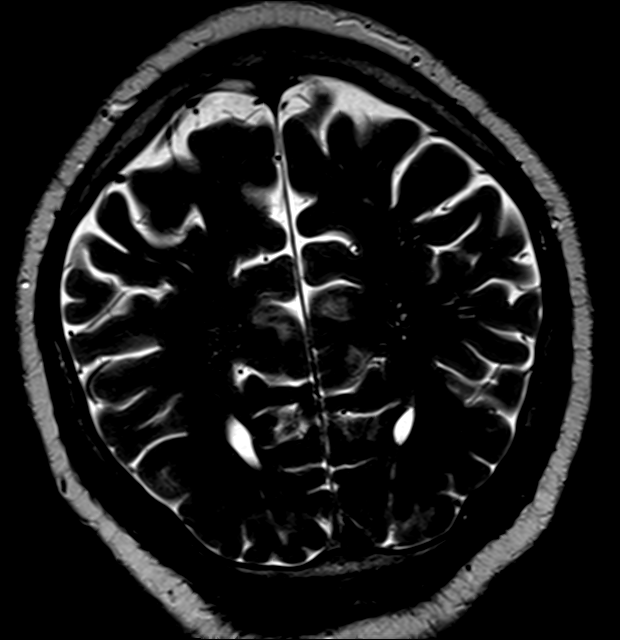

[Series 13: FLAIR · coronal · 3.0mm · 0.56mm/px · 3 of 38 slices shown (1 of 2)]
[im 1/38]
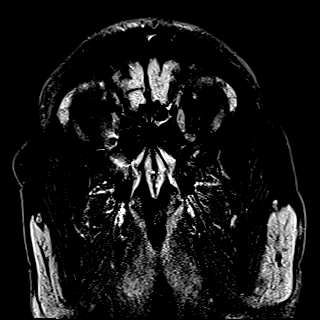
[im 19/38]
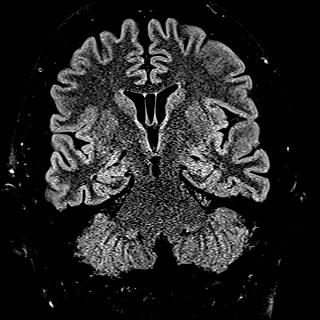
[im 38/38]
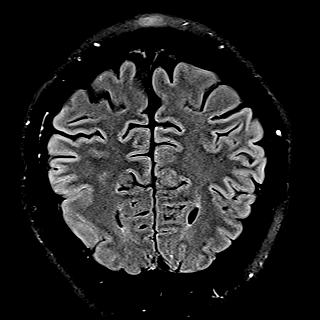

[Series 14: FLAIR · axial · 5.0mm · 0.45mm/px · z∈[-84,+71]mm · 2 of 27 slices shown (2 of 2)]
[im 1/27]
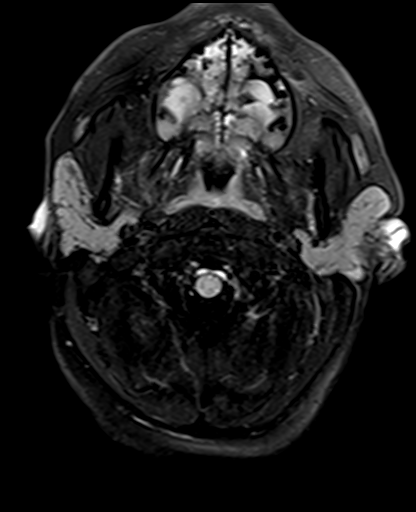
[im 27/27]
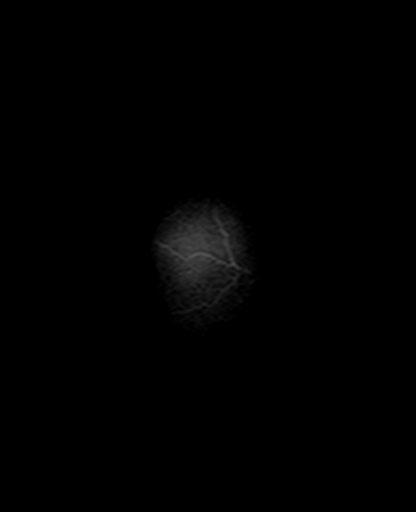

[Series 15: mag_images · axial · 3.0mm · 0.90mm/px · z∈[-78,+62]mm · 3 of 48 slices shown]
[im 1/48]
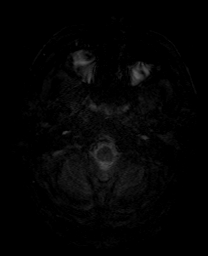
[im 24/48]
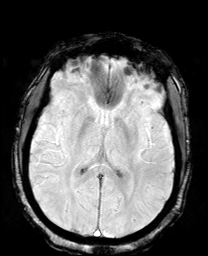
[im 48/48]
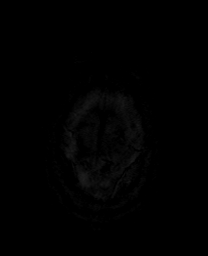

[Series 16: pha_images · axial · 3.0mm · 0.90mm/px · z∈[-78,+62]mm · 3 of 48 slices shown]
[im 1/48]
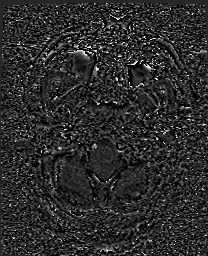
[im 24/48]
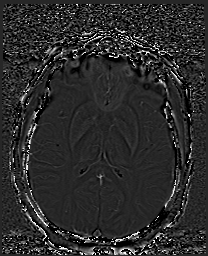
[im 48/48]
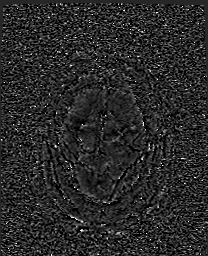

[Series 17: swi_images · axial · 3.0mm · 0.90mm/px · z∈[-78,+62]mm · 3 of 48 slices shown]
[im 1/48]
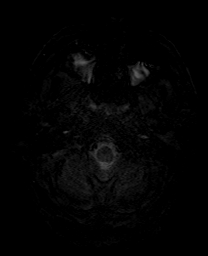
[im 24/48]
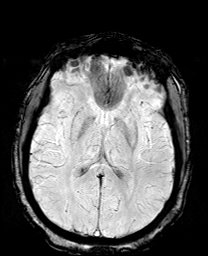
[im 48/48]
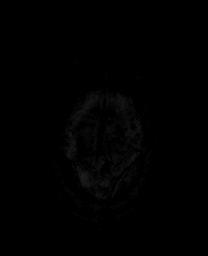

[Series 18: mip_images(sw) · axial · 24.0mm · 0.90mm/px · 1 of 41 slices shown]
[im 1/41]
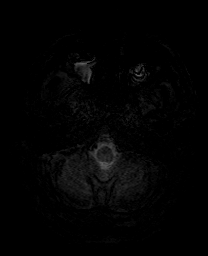

[Series 20: T2 · coronal · 5.0mm · 0.43mm/px · 2 of 31 slices shown (3 of 3)]
[im 1/31]
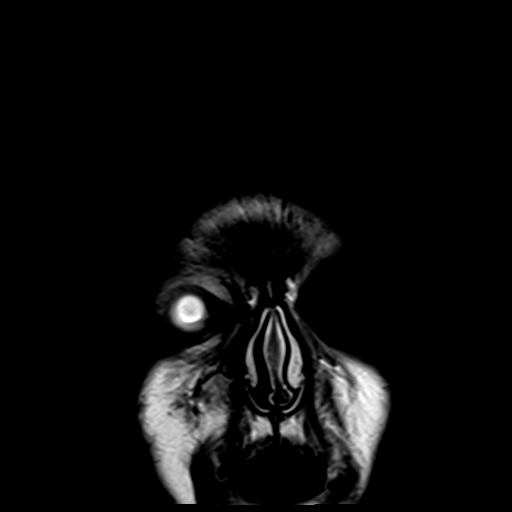
[im 31/31]
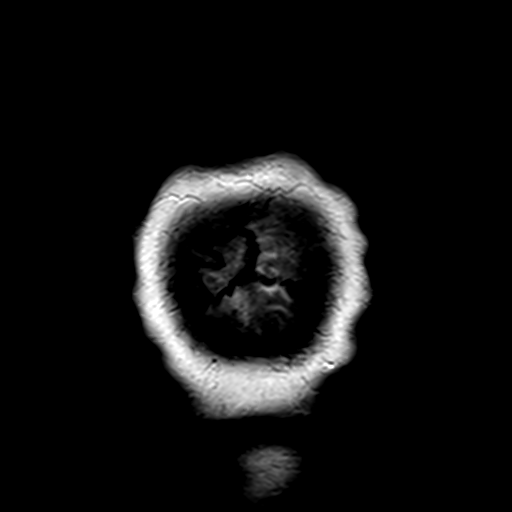

[43 of 48 positions shown; findings below may reference images not displayed]

FINDINGS: The study is intermittently mildly to moderately motion degraded.

Brain: There is no evidence of an acute infarct, intracranial
hemorrhage, mass, midline shift, or extra-axial fluid collection.
The ventricles and sulci are normal in size. A cavum septum
pellucidum et vergae is incidentally noted. The brain is normal in
signal. A nonenlarged partially empty sella is incidentally noted.

Dedicated thin section imaging through the temporal lobes
demonstrates normal volume and signal of the hippocampi. There is no
evidence of heterotopia or cortical dysplasia.

Vascular: Major intracranial vascular flow voids are preserved.

Skull and upper cervical spine: Unremarkable bone marrow signal.

Sinuses/Orbits: Unremarkable orbits. Large bilateral maxillary sinus
mucous retention cysts with small mucous retention cysts in the
sphenoid sinuses and mild left frontoethmoid junction mucosal
thickening. No mastoid fluid.

Other: None.
IMPRESSION: No acute intracranial abnormality or etiology of seizures
identified.
